# Patient Record
Sex: Male | Born: 1960 | Race: White | Hispanic: No | Marital: Married | Smoking: Current every day smoker
Health system: Southern US, Community
[De-identification: ages and names within clinical notes are randomized; demographics above are authoritative.]

## PROBLEM LIST (undated history)

## (undated) ENCOUNTER — Emergency Department (HOSPITAL_COMMUNITY): Discharge status: Absent without leave | Payer: Self-pay

## (undated) DIAGNOSIS — K7460 Unspecified cirrhosis of liver: Secondary | ICD-10-CM

---

## 2001-04-09 ENCOUNTER — Emergency Department (HOSPITAL_COMMUNITY): Admission: EM | Admit: 2001-04-09 | Discharge: 2001-04-10 | Payer: Self-pay | Admitting: Emergency Medicine

## 2019-07-25 ENCOUNTER — Other Ambulatory Visit: Payer: Self-pay

## 2019-07-25 ENCOUNTER — Encounter (HOSPITAL_COMMUNITY): Payer: Self-pay

## 2019-07-25 DIAGNOSIS — K921 Melena: Principal | ICD-10-CM | POA: Insufficient documentation

## 2019-07-25 DIAGNOSIS — F1010 Alcohol abuse, uncomplicated: Secondary | ICD-10-CM | POA: Diagnosis not present

## 2019-07-25 DIAGNOSIS — Z20828 Contact with and (suspected) exposure to other viral communicable diseases: Secondary | ICD-10-CM | POA: Diagnosis not present

## 2019-07-25 DIAGNOSIS — I8500 Esophageal varices without bleeding: Secondary | ICD-10-CM | POA: Diagnosis not present

## 2019-07-25 DIAGNOSIS — K319 Disease of stomach and duodenum, unspecified: Secondary | ICD-10-CM | POA: Insufficient documentation

## 2019-07-25 DIAGNOSIS — F17200 Nicotine dependence, unspecified, uncomplicated: Secondary | ICD-10-CM | POA: Insufficient documentation

## 2019-07-25 DIAGNOSIS — R109 Unspecified abdominal pain: Secondary | ICD-10-CM | POA: Insufficient documentation

## 2019-07-25 DIAGNOSIS — D62 Acute posthemorrhagic anemia: Secondary | ICD-10-CM | POA: Insufficient documentation

## 2019-07-25 DIAGNOSIS — K210 Gastro-esophageal reflux disease with esophagitis: Secondary | ICD-10-CM | POA: Insufficient documentation

## 2019-07-25 DIAGNOSIS — K7460 Unspecified cirrhosis of liver: Secondary | ICD-10-CM | POA: Diagnosis not present

## 2019-07-25 DIAGNOSIS — K449 Diaphragmatic hernia without obstruction or gangrene: Secondary | ICD-10-CM | POA: Insufficient documentation

## 2019-07-25 DIAGNOSIS — R945 Abnormal results of liver function studies: Secondary | ICD-10-CM | POA: Insufficient documentation

## 2019-07-25 MED ORDER — SODIUM CHLORIDE 0.9% FLUSH
3.0000 mL | Freq: Once | INTRAVENOUS | Status: AC
  Administered 2019-07-26: 3 mL via INTRAVENOUS

## 2019-07-25 NOTE — ED Triage Notes (Signed)
Pt reports abdominal pain and bloating for the last 2 weeks. Denies vomiting. Reports that he just recently cut down drinking.  Pt also reports hemoptysis starting last night. He states that he has been a smoker for many years and would like a chest XR and a TB test.   A&Ox4. Ambulatory.

## 2019-07-26 ENCOUNTER — Encounter (HOSPITAL_COMMUNITY)
Admission: EM | Disposition: A | Discharge status: Home / Self Care | Payer: Self-pay | Source: Home / Self Care | Attending: Emergency Medicine

## 2019-07-26 ENCOUNTER — Emergency Department (HOSPITAL_COMMUNITY): Payer: Medicaid Other

## 2019-07-26 ENCOUNTER — Observation Stay (HOSPITAL_COMMUNITY)
Admission: EM | Admit: 2019-07-26 | Discharge: 2019-07-27 | Disposition: A | Discharge status: Home / Self Care | Payer: Medicaid Other | Attending: Internal Medicine | Admitting: Internal Medicine

## 2019-07-26 ENCOUNTER — Observation Stay (HOSPITAL_COMMUNITY): Payer: Medicaid Other | Admitting: Anesthesiology

## 2019-07-26 ENCOUNTER — Encounter (HOSPITAL_COMMUNITY): Payer: Self-pay | Admitting: Internal Medicine

## 2019-07-26 ENCOUNTER — Observation Stay (HOSPITAL_COMMUNITY): Payer: Medicaid Other

## 2019-07-26 DIAGNOSIS — K921 Melena: Secondary | ICD-10-CM | POA: Diagnosis not present

## 2019-07-26 DIAGNOSIS — R1084 Generalized abdominal pain: Secondary | ICD-10-CM | POA: Diagnosis not present

## 2019-07-26 DIAGNOSIS — R059 Cough, unspecified: Secondary | ICD-10-CM

## 2019-07-26 DIAGNOSIS — R05 Cough: Secondary | ICD-10-CM

## 2019-07-26 DIAGNOSIS — K922 Gastrointestinal hemorrhage, unspecified: Secondary | ICD-10-CM | POA: Diagnosis not present

## 2019-07-26 DIAGNOSIS — R109 Unspecified abdominal pain: Secondary | ICD-10-CM | POA: Insufficient documentation

## 2019-07-26 DIAGNOSIS — K7460 Unspecified cirrhosis of liver: Secondary | ICD-10-CM | POA: Diagnosis not present

## 2019-07-26 DIAGNOSIS — R945 Abnormal results of liver function studies: Secondary | ICD-10-CM | POA: Diagnosis not present

## 2019-07-26 HISTORY — PX: ESOPHAGOGASTRODUODENOSCOPY (EGD) WITH PROPOFOL: SHX5813

## 2019-07-26 LAB — URINALYSIS, ROUTINE W REFLEX MICROSCOPIC
Bilirubin Urine: NEGATIVE
Glucose, UA: NEGATIVE mg/dL
Hgb urine dipstick: NEGATIVE
Ketones, ur: NEGATIVE mg/dL
Leukocytes,Ua: NEGATIVE
Nitrite: NEGATIVE
Protein, ur: NEGATIVE mg/dL
Specific Gravity, Urine: 1.027 (ref 1.005–1.030)
pH: 5 (ref 5.0–8.0)

## 2019-07-26 LAB — CBC
HCT: 37.1 % — ABNORMAL LOW (ref 39.0–52.0)
HCT: 37.5 % — ABNORMAL LOW (ref 39.0–52.0)
HCT: 37.6 % — ABNORMAL LOW (ref 39.0–52.0)
HCT: 41.4 % (ref 39.0–52.0)
Hemoglobin: 12.3 g/dL — ABNORMAL LOW (ref 13.0–17.0)
Hemoglobin: 12.4 g/dL — ABNORMAL LOW (ref 13.0–17.0)
Hemoglobin: 12.6 g/dL — ABNORMAL LOW (ref 13.0–17.0)
Hemoglobin: 13.6 g/dL (ref 13.0–17.0)
MCH: 34.9 pg — ABNORMAL HIGH (ref 26.0–34.0)
MCH: 35 pg — ABNORMAL HIGH (ref 26.0–34.0)
MCH: 35 pg — ABNORMAL HIGH (ref 26.0–34.0)
MCH: 35.3 pg — ABNORMAL HIGH (ref 26.0–34.0)
MCHC: 32.9 g/dL (ref 30.0–36.0)
MCHC: 33.1 g/dL (ref 30.0–36.0)
MCHC: 33.2 g/dL (ref 30.0–36.0)
MCHC: 33.5 g/dL (ref 30.0–36.0)
MCV: 105.3 fL — ABNORMAL HIGH (ref 80.0–100.0)
MCV: 105.6 fL — ABNORMAL HIGH (ref 80.0–100.0)
MCV: 105.7 fL — ABNORMAL HIGH (ref 80.0–100.0)
MCV: 106.4 fL — ABNORMAL HIGH (ref 80.0–100.0)
Platelets: 140 10*3/uL — ABNORMAL LOW (ref 150–400)
Platelets: 140 10*3/uL — ABNORMAL LOW (ref 150–400)
Platelets: 149 10*3/uL — ABNORMAL LOW (ref 150–400)
Platelets: 156 10*3/uL (ref 150–400)
RBC: 3.51 MIL/uL — ABNORMAL LOW (ref 4.22–5.81)
RBC: 3.55 MIL/uL — ABNORMAL LOW (ref 4.22–5.81)
RBC: 3.57 MIL/uL — ABNORMAL LOW (ref 4.22–5.81)
RBC: 3.89 MIL/uL — ABNORMAL LOW (ref 4.22–5.81)
RDW: 14.3 % (ref 11.5–15.5)
RDW: 14.3 % (ref 11.5–15.5)
RDW: 14.3 % (ref 11.5–15.5)
RDW: 14.5 % (ref 11.5–15.5)
WBC: 10.7 10*3/uL — ABNORMAL HIGH (ref 4.0–10.5)
WBC: 7.8 10*3/uL (ref 4.0–10.5)
WBC: 8.1 10*3/uL (ref 4.0–10.5)
WBC: 9 10*3/uL (ref 4.0–10.5)
nRBC: 0 % (ref 0.0–0.2)
nRBC: 0 % (ref 0.0–0.2)
nRBC: 0 % (ref 0.0–0.2)
nRBC: 0 % (ref 0.0–0.2)

## 2019-07-26 LAB — GLUCOSE, CAPILLARY
Glucose-Capillary: 88 mg/dL (ref 70–99)
Glucose-Capillary: 77 mg/dL (ref 70–99)
Glucose-Capillary: 71 mg/dL (ref 70–99)
Glucose-Capillary: 74 mg/dL (ref 70–99)

## 2019-07-26 LAB — HEPATIC FUNCTION PANEL
ALT: 65 U/L — ABNORMAL HIGH (ref 0–44)
AST: 109 U/L — ABNORMAL HIGH (ref 15–41)
Albumin: 2.7 g/dL — ABNORMAL LOW (ref 3.5–5.0)
Alkaline Phosphatase: 213 U/L — ABNORMAL HIGH (ref 38–126)
Bilirubin, Direct: 0.8 mg/dL — ABNORMAL HIGH (ref 0.0–0.2)
Indirect Bilirubin: 1.2 mg/dL — ABNORMAL HIGH (ref 0.3–0.9)
Total Bilirubin: 2 mg/dL — ABNORMAL HIGH (ref 0.3–1.2)
Total Protein: 6.4 g/dL — ABNORMAL LOW (ref 6.5–8.1)

## 2019-07-26 LAB — COMPREHENSIVE METABOLIC PANEL
ALT: 74 U/L — ABNORMAL HIGH (ref 0–44)
AST: 124 U/L — ABNORMAL HIGH (ref 15–41)
Albumin: 3.1 g/dL — ABNORMAL LOW (ref 3.5–5.0)
Alkaline Phosphatase: 268 U/L — ABNORMAL HIGH (ref 38–126)
Anion gap: 8 (ref 5–15)
BUN: 17 mg/dL (ref 6–20)
CO2: 27 mmol/L (ref 22–32)
Calcium: 9.1 mg/dL (ref 8.9–10.3)
Chloride: 103 mmol/L (ref 98–111)
Creatinine, Ser: 0.68 mg/dL (ref 0.61–1.24)
GFR calc Af Amer: >60 mL/min (ref >60)
GFR calc non Af Amer: >60 mL/min (ref >60)
Glucose, Bld: 107 mg/dL — ABNORMAL HIGH (ref 70–99)
Potassium: 4.9 mmol/L (ref 3.5–5.1)
Sodium: 138 mmol/L (ref 135–145)
Total Bilirubin: 1.8 mg/dL — ABNORMAL HIGH (ref 0.3–1.2)
Total Protein: 7.5 g/dL (ref 6.5–8.1)

## 2019-07-26 LAB — OCCULT BLOOD, POC DEVICE: Fecal Occult Bld: POSITIVE — AB

## 2019-07-26 LAB — TYPE AND SCREEN
ABO/RH(D): A POS
Antibody Screen: NEGATIVE

## 2019-07-26 LAB — PROTIME-INR
INR: 1 (ref 0.8–1.2)
Prothrombin Time: 12.9 seconds (ref 11.4–15.2)

## 2019-07-26 LAB — APTT: aPTT: 28 seconds (ref 24–36)

## 2019-07-26 LAB — SARS CORONAVIRUS 2 BY RT PCR (HOSPITAL ORDER, PERFORMED IN ~~LOC~~ HOSPITAL LAB): SARS Coronavirus 2: NEGATIVE

## 2019-07-26 LAB — LIPASE, BLOOD: Lipase: 48 U/L (ref 11–51)

## 2019-07-26 SURGERY — ESOPHAGOGASTRODUODENOSCOPY (EGD) WITH PROPOFOL
Anesthesia: Monitor Anesthesia Care

## 2019-07-26 MED ORDER — THIAMINE HCL 100 MG/ML IJ SOLN
100.0000 mg | Freq: Every day | INTRAMUSCULAR | Status: DC
  Administered 2019-07-26: 100 mg via INTRAVENOUS
  Filled 2019-07-26: qty 2

## 2019-07-26 MED ORDER — PROPOFOL 500 MG/50ML IV EMUL
INTRAVENOUS | Status: DC | PRN
  Administered 2019-07-26: 125 ug/kg/min via INTRAVENOUS

## 2019-07-26 MED ORDER — SODIUM CHLORIDE 0.9 % IV SOLN
8.0000 mg/h | INTRAVENOUS | Status: DC
  Administered 2019-07-26 (×2): 8 mg/h via INTRAVENOUS
  Filled 2019-07-26 (×4): qty 80

## 2019-07-26 MED ORDER — PROPOFOL 10 MG/ML IV BOLUS
INTRAVENOUS | Status: DC | PRN
  Administered 2019-07-26: 20 mg via INTRAVENOUS

## 2019-07-26 MED ORDER — ONDANSETRON HCL 4 MG PO TABS: 4.0000 mg | ORAL_TABLET | Freq: Four times a day (QID) | ORAL | Status: DC | PRN

## 2019-07-26 MED ORDER — ONDANSETRON HCL 4 MG/2ML IJ SOLN
4.0000 mg | Freq: Four times a day (QID) | INTRAMUSCULAR | Status: DC | PRN
  Administered 2019-07-26: 4 mg via INTRAVENOUS

## 2019-07-26 MED ORDER — SODIUM CHLORIDE 0.9 % IV SOLN: INTRAVENOUS | Status: DC

## 2019-07-26 MED ORDER — ACETAMINOPHEN 325 MG PO TABS
650.0000 mg | ORAL_TABLET | Freq: Four times a day (QID) | ORAL | Status: DC | PRN
  Administered 2019-07-27: 650 mg via ORAL
  Filled 2019-07-26: qty 2

## 2019-07-26 MED ORDER — VITAMIN B-1 100 MG PO TABS
100.0000 mg | ORAL_TABLET | Freq: Every day | ORAL | Status: DC
  Administered 2019-07-27: 100 mg via ORAL
  Filled 2019-07-26: qty 1

## 2019-07-26 MED ORDER — SODIUM CHLORIDE 0.9 % IV SOLN
1.0000 g | INTRAVENOUS | Status: DC
  Administered 2019-07-26 – 2019-07-27 (×2): 1 g via INTRAVENOUS
  Filled 2019-07-26: qty 1
  Filled 2019-07-26: qty 10

## 2019-07-26 MED ORDER — PANTOPRAZOLE SODIUM 40 MG IV SOLR: 40.0000 mg | Freq: Two times a day (BID) | INTRAVENOUS | Status: DC

## 2019-07-26 MED ORDER — ACETAMINOPHEN 650 MG RE SUPP: 650.0000 mg | Freq: Four times a day (QID) | RECTAL | Status: DC | PRN

## 2019-07-26 MED ORDER — LIDOCAINE 2% (20 MG/ML) 5 ML SYRINGE
INTRAMUSCULAR | Status: DC | PRN
  Administered 2019-07-26: 80 mg via INTRAVENOUS

## 2019-07-26 MED ORDER — LACTATED RINGERS IV SOLN
INTRAVENOUS | Status: DC
  Administered 2019-07-26 (×2): via INTRAVENOUS

## 2019-07-26 MED ORDER — MIDAZOLAM HCL 2 MG/2ML IJ SOLN
INTRAMUSCULAR | Status: AC
  Filled 2019-07-26: qty 2

## 2019-07-26 MED ORDER — PANTOPRAZOLE SODIUM 40 MG IV SOLR
40.0000 mg | Freq: Once | INTRAVENOUS | Status: AC
  Administered 2019-07-26: 40 mg via INTRAVENOUS
  Filled 2019-07-26: qty 40

## 2019-07-26 MED ORDER — MIDAZOLAM HCL 5 MG/5ML IJ SOLN
INTRAMUSCULAR | Status: DC | PRN
  Administered 2019-07-26: 2 mg via INTRAVENOUS

## 2019-07-26 SURGICAL SUPPLY — 15 items

## 2019-07-26 NOTE — ED Notes (Signed)
Assisted with rectal exam

## 2019-07-26 NOTE — Interval H&P Note (Signed)
History and Physical Interval Note:  07/26/2019 2:38 PM  Bobby Dixon  has presented today for surgery, with the diagnosis of melena.  The various methods of treatment have been discussed with the patient. After consideration of risks, benefits and other options for treatment, the patient has consented to  Procedure(s): ESOPHAGOGASTRODUODENOSCOPY (EGD) WITH PROPOFOL (N/A) as a surgical intervention.  The patient's history has been reviewed, patient examined, no change in status, stable for surgery.  I have reviewed the patient's chart and labs.  Questions were answered to the patient's satisfaction.     Youlanda Mighty [name]

## 2019-07-26 NOTE — Anesthesia Preprocedure Evaluation (Signed)
Anesthesia Evaluation  Patient identified by MRN, date of birth, ID band Patient awake    Reviewed: Allergy & Precautions, NPO status , Patient's Chart, lab work & pertinent test results  Airway Mallampati: II  TM Distance: >3 FB Neck ROM: Full    Dental no notable dental hx.    Pulmonary neg pulmonary ROS, Current Smoker,    Pulmonary exam normal breath sounds clear to auscultation       Cardiovascular negative cardio ROS Normal cardiovascular exam Rhythm:Regular Rate:Normal     Neuro/Psych negative neurological ROS  negative psych ROS   GI/Hepatic (+) Cirrhosis     substance abuse  alcohol use, GI bleed   Endo/Other  negative endocrine ROS  Renal/GU negative Renal ROS  negative genitourinary   Musculoskeletal negative musculoskeletal ROS (+)   Abdominal   Peds negative pediatric ROS (+)  Hematology negative hematology ROS (+)   Anesthesia Other Findings   Reproductive/Obstetrics negative OB ROS                             Anesthesia Physical Anesthesia Plan  ASA: II  Anesthesia Plan: MAC   Post-op Pain Management:    Induction:   PONV Risk Score and Plan: Treatment may vary due to age or medical condition  Airway Management Planned: Nasal Cannula and Simple Face Mask  Additional Equipment:   Intra-op Plan:   Post-operative Plan:   Informed Consent: I have reviewed the patients History and Physical, chart, labs and discussed the procedure including the risks, benefits and alternatives for the proposed anesthesia with the patient or authorized representative who has indicated his/her understanding and acceptance.     Dental advisory given  Plan Discussed with:   Anesthesia Plan Comments:         Anesthesia Quick Evaluation

## 2019-07-26 NOTE — Progress Notes (Signed)
Report given to floor by Levander Campion, RN patient currently in Endo

## 2019-07-26 NOTE — Anesthesia Postprocedure Evaluation (Signed)
Anesthesia Post Note  Patient: Bobby Dixon  Procedure(s) Performed: ESOPHAGOGASTRODUODENOSCOPY (EGD) WITH PROPOFOL (N/A )     Patient location during evaluation: Endoscopy Anesthesia Type: MAC Level of consciousness: awake and alert Pain management: pain level controlled Vital Signs Assessment: post-procedure vital signs reviewed and stable Respiratory status: spontaneous breathing, nonlabored ventilation, respiratory function stable and patient connected to nasal cannula oxygen Cardiovascular status: stable and blood pressure returned to baseline Postop Assessment: no apparent nausea or vomiting Anesthetic complications: no    Last Vitals:  Vitals:   07/26/19 1540 07/26/19 1607  BP: 128/83 (!) 137/96  Pulse: (!) 56 (!) 58  Resp: 18 18  Temp:  36.7 C  SpO2: 96% 98%    Last Pain:  Vitals:   07/26/19 1607  TempSrc: Oral  PainSc:                  [name]

## 2019-07-26 NOTE — Op Note (Signed)
[facility] Patient Name: Bobby Dixon Procedure Date: 07/26/2019 MRN: [id number] Attending MD: Ronald Lobo , MD Date of Birth: [birth year] CSN: [id number] Age: 58 Admit Type: Emergency Department Procedure:                Upper GI endoscopy Indications:              Melena, mild anemia, no hemodynamic instability in                            a patient with excessive alcohol consumption and                            nodular liver with slight ascites on ultrasound. Providers:                Ronald Lobo, MD, Vista Lawman, RN, Laverda Sorenson, Technician, Arnoldo Hooker, CRNA Referring MD:              Medicines:                Monitored Anesthesia Care Complications:            No immediate complications. Estimated Blood Loss:     Estimated blood loss: none. Procedure:                Pre-Anesthesia Assessment:                           - Prior to the procedure, a History and Physical                            was performed, and patient medications and                            allergies were reviewed. The patient's tolerance of                            previous anesthesia was also reviewed. The risks                            and benefits of the procedure and the sedation                            options and risks were discussed with the patient.                            All questions were answered, and informed consent                            was obtained. Prior Anticoagulants: The patient has                            taken no previous anticoagulant or antiplatelet  agents except for NSAID medication. ASA Grade                            Assessment: II - A patient with mild systemic                            disease. After reviewing the risks and benefits,                            the patient was deemed in satisfactory condition to                            undergo the procedure.        After obtaining informed consent, the endoscope was                            passed under direct vision. Throughout the                            procedure, the patient's blood pressure, pulse, and                            oxygen saturations were monitored continuously. The                            [id number] Olympus gastroscope was                            introduced through the mouth, and advanced to the                            second part of duodenum. The upper GI endoscopy was                            accomplished without difficulty. The patient                            tolerated the procedure well. Scope In: Scope Out: Findings:      The larynx was normal.      Mild multifocal erosive esophagitis with no bleeding or stigma of       hemorrhage was found right at the GE junction.      Grade II varices were found in the lower third of the esophagus.      A small hiatal hernia was present.      The stomach contained no blood or coffee ground material.      A few scattered, small non-bleeding erosions were found in the gastric       antrum, consistent with NSAID gastropathy. There was one linear       superficial ulceration. There were no stigmata of recent bleeding.      The exam of the stomach was otherwise normal.      There is no endoscopic evidence of bleeding, varices or portal       hypertension gastropathy in the entire examined stomach.      The cardia  and gastric fundus were normal on retroflexion.      The examined duodenum was normal, including 4-quadrant inspection of the       bulb. Impression:               - Normal larynx.                           - Mildly severe reflux esophagitis.                           - Grade II esophageal varices.                           - Small hiatal hernia.                           - Non-bleeding erosive gastropathy.                           - Normal examined duodenum.                           - No  specimens collected. Moderate Sedation:      This patient was sedated with monitored anesthesia care, not moderate       sedation. Recommendation:           - Continue present medications, specifically IV                            Protonix; continue PO PPI for at least 1 month post                            discharge. NSAID avoidance.                           - I elected not to band the patient's varices in                            view of their relatively small size and the                            presence of alternative sources of melena which                            seem more compatible with the patient's clinical                            history.                           - Alcohol cessation is essential in view of varices.                           - Repeat upper endoscopy in 1 year for screening  purposes to monitor varices. Procedure Code(s):        --- Professional ---                           [phone], Esophagogastroduodenoscopy, flexible,                            transoral; diagnostic, including collection of                            specimen(s) by brushing or washing, when performed                            (separate procedure) Diagnosis Code(s):        --- Professional ---                           K21.0, Gastro-esophageal reflux disease with                            esophagitis                           I85.00, Esophageal varices without bleeding                           K92.1, Melena (includes Hematochezia)                           K31.89, Other diseases of stomach and duodenum CPT copyright 2019 American Medical Association. All rights reserved. The codes documented in this report are preliminary and upon coder review may  be revised to meet current compliance requirements. Ronald Lobo, MD 07/26/2019 3:34:52 PM This report has been signed electronically. Number of Addenda: 0

## 2019-07-26 NOTE — Consult Note (Signed)
Referring Provider:  Dr. Rudolpho Sevin Primary Care Physician:  Patient, No Pcp Per Primary Gastroenterologist: None (unassigned)  Reason for Consultation: Melena  HPI: Bobby Dixon is a 58 y.o. male being admitted through the emergency room with a 2-day history of melenic stools, in the setting of a roughly 2-week history of nonspecific upper abdominal symptoms.  The patient indicates that, normally, he drinks 6-8 beers per day, more on the weekends, but about 2 weeks ago, he lost his taste for beer and, because of abdominal pain, started taking between 2 and 4 Advil tablets daily.  There is no prior history of ulcer disease or GI bleeding or alcohol related GI tract complications, although an ultrasound obtained through the emergency room does show evidence of a nodular liver suggestive of cirrhosis, and a small amount of ascites present.  He is not aware of any weight loss.  He has not had any orthostatic symptoms or presyncope in association with the dark stools.  The patient indicates he has never had colonoscopy.   History reviewed. No pertinent past medical history.  History reviewed. No pertinent surgical history.  Prior to Admission medications   Medication Sig Start Date End Date Taking? Authorizing Provider  naproxen sodium (ALEVE) 220 MG tablet Take 220 mg by mouth 2 (two) times daily as needed (pain).   Yes [provider]    Current Facility-Administered Medications  Medication Dose Route Frequency Provider Last Rate Last Dose  . acetaminophen (TYLENOL) tablet 650 mg  650 mg Oral Q6H PRN Rise Patience, MD       Or  . acetaminophen (TYLENOL) suppository 650 mg  650 mg Rectal Q6H PRN Rise Patience, MD      . cefTRIAXone (ROCEPHIN) 1 g in sodium chloride 0.9 % 100 mL IVPB  1 g Intravenous Q24H Rise Patience, MD 200 mL/hr at 07/26/19 0645 1 g at 07/26/19 0645  . ondansetron (ZOFRAN) tablet 4 mg  4 mg Oral Q6H PRN Rise Patience, MD       Or  . ondansetron [facility]) injection 4 mg  4 mg Intravenous Q6H PRN Rise Patience, MD      . pantoprazole (PROTONIX) 80 mg in sodium chloride 0.9 % 250 mL (0.32 mg/mL) infusion  8 mg/hr Intravenous Continuous Rise Patience, MD 25 mL/hr at 07/26/19 0741 8 mg/hr at 07/26/19 0741  . [START ON 07/29/2019] pantoprazole (PROTONIX) injection 40 mg  40 mg Intravenous Q12H Rise Patience, MD      . thiamine (B-1) injection 100 mg  100 mg Intravenous Daily Rise Patience, MD   100 mg at 07/26/19 [id number]   Current Outpatient Medications  Medication Sig Dispense Refill  . naproxen sodium (ALEVE) 220 MG tablet Take 220 mg by mouth 2 (two) times daily as needed (pain).      Allergies as of 07/25/2019  . (No Known Allergies)    Family History  Problem Relation Age of Onset  . Diabetes Mellitus II Neg Hx     Social History   Socioeconomic History  . Marital status: Married    Spouse name: Not on file  . Number of children: Not on file  . Years of education: Not on file  . Highest education level: Not on file  Occupational History  . Not on file  Social Needs  . Financial resource strain: Not on file  . Food insecurity    Worry: Not on file    Inability: Not on file  .  Transportation needs    Medical: Not on file    Non-medical: Not on file  Tobacco Use  . Smoking status: Current Every Day Smoker  . Smokeless tobacco: Never Used  Substance and Sexual Activity  . Alcohol use: Not Currently    Comment: quit two weeks ago  . Drug use: Not on file  . Sexual activity: Not on file  Lifestyle  . Physical activity    Days per week: Not on file    Minutes per session: Not on file  . Stress: Not on file  Relationships  . Social Herbalist on phone: Not on file    Gets together: Not on file    Attends religious service: Not on file    Active member of club or organization: Not on file    Attends meetings of clubs or organizations: Not on file     Relationship status: Not on file  . Intimate partner violence    Fear of current or ex partner: Not on file    Emotionally abused: Not on file    Physically abused: Not on file    Forced sexual activity: Not on file  Other Topics Concern  . Not on file  Social History Narrative  . Not on file    Review of Systems: The patient reports psoriasis since he was in his 25s, and dyspnea on moderate to heavy exertion (is able to walk comfortably, but not run).  No dysphagia, weight loss, exertional chest pain, lymphadenopathy, urinary symptoms.  Physical Exam: Vital signs in last 24 hours: Temp:  [98.8 F (37.1 C)-99.6 F (37.6 C)] 98.8 F (37.1 C) (08/15 0241) Pulse Rate:  [60-102] 69 (08/15 1130) Resp:  [16-24] 20 (08/15 1130) BP: (116-163)/(76-102) 116/76 (08/15 1130) SpO2:  [91 %-97 %] 95 % (08/15 1130) Weight:  [78.1 kg] 78.1 kg (08/14 2009)   General:   Alert,  Well-developed, lean but well-nourished, pleasant and cooperative in NAD Head:  Normocephalic and atraumatic. Eyes:  Sclera clear, no icterus.    Lungs:  Clear throughout to auscultation.   No wheezes, crackles, or rhonchi. No evident respiratory distress. Heart:   Regular rate and rhythm; no murmurs, clicks, rubs,  or gallops. Abdomen:  Soft, nontender, nontympanitic, and nondistended. No masses, hepatosplenomegaly or ventral hernias noted. .   Rectal:   Not performed, but stool reported as melenic, and confirmed heme positive Msk:   Symmetrical without gross deformities. Pulses:  Normal radial pulse is noted. Extremities:   Without clubbing, cyanosis, or edema. Neurologic:  Alert and coherent;  grossly normal neurologically. Skin:  Intact without significant lesions or rashes. Psych:   Alert and cooperative. Normal mood and affect.  Intake/Output from previous day: No intake/output data recorded. Intake/Output this shift: No intake/output data recorded.  Lab Results: Recent Labs    07/26/19 0024 07/26/19 0619  07/26/19 0835  WBC 10.7* 9.0 8.1  HGB 13.6 12.6* 12.4*  HCT 41.4 37.6* 37.5*  PLT 156 149* 140*   BMET Recent Labs    07/26/19 0024  NA 138  K 4.9  CL 103  CO2 27  GLUCOSE 107*  BUN 17  CREATININE 0.68  CALCIUM 9.1   LFT Recent Labs    07/26/19 0910  PROT 6.4*  ALBUMIN 2.7*  AST 109*  ALT 65*  ALKPHOS 213*  BILITOT 2.0*  BILIDIR 0.8*  IBILI 1.2*   PT/INR No results for input(s): LABPROT, INR in the last 72 hours.  Studies/Results: Dg Chest  2 View  Result Date: 07/26/2019 CLINICAL DATA:  58 year old male with cough. EXAM: CHEST - 2 VIEW COMPARISON:  None. FINDINGS: The heart size and mediastinal contours are within normal limits. Both lungs are clear. The visualized skeletal structures are unremarkable. IMPRESSION: No active cardiopulmonary disease. Electronically Signed   By: Anner Crete M.D.   On: 07/26/2019 01:44   US Abdomen Complete  Result Date: 07/26/2019 CLINICAL DATA:  Abdominal pain for several weeks. Hematemesis. Alcohol misuse. EXAM: ABDOMEN ULTRASOUND COMPLETE COMPARISON:  None FINDINGS: Gallbladder: No gallstones or wall thickening visualized. No sonographic Murphy sign noted by sonographer. Diffuse gallbladder wall thickening is likely secondary to cirrhosis. Common bile duct: Diameter: 3 mm, within normal limits. Liver: Nodularity of capsular contour is consistent with cirrhosis. Within normal limits in parenchymal echogenicity. No liver mass identified. Mild perihepatic ascites is seen. Portal vein is patent on color Doppler imaging with normal direction of blood flow towards the liver. IVC: No abnormality visualized. Pancreas: Visualized portion unremarkable. Spleen: Size and appearance within normal limits. Mild perisplenic ascites seen in left upper quadrant. Right Kidney: Length: 10.8 cm. Echogenicity within normal limits. No mass or hydronephrosis visualized. Left Kidney: Length: 10.5 cm. Echogenicity within normal limits. No mass or hydronephrosis  visualized. Abdominal aorta: No aneurysm visualized. Other findings: None. IMPRESSION: Hepatic cirrhosis.  No liver mass visualized. Mild ascites. No evidence of gallstones or biliary ductal dilatation. Diffuse gallbladder wall thickening is likely secondary to cirrhosis. Electronically Signed   By: Marlaine Hind M.D.   On: 07/26/2019 08:01    Impression: 1.  Subacute, non-destabilizing GI bleed, probably of upper tract origin in view of melenic character of stool and ulcer risk factors (NSAID exposure). 2.  Mild posthemorrhagic anemia, acute 3.  Nodular liver on ultrasound, and mild ascites present--suggestive of chronic liver disease with fibrosis if not frank cirrhosis.  Low post hydration albumin of 2.7 could be nutritional, although it could imply impaired hepatic synthetic function; mild thrombocytopenia could be bone marrow suppression from alcohol although I think mild portal hypertension is a more likely explanation in view of the fact there has been no alcohol for the past 2 weeks. 4.  Mild elevation of liver chemistries with a classic OT/PT split suggestive of alcohol induced liver inflammation.  Plan: 1.  Endoscopic evaluation later today.  Petra Kuba, purpose, risks, benefits, and alternatives of empiric medical therapy discussed and patient is agreeable to proceed.  2.  Agree with antibiotic therapy in view of GI bleeding in a cirrhotic with ascites.  3.  At present, given the very low grade of gastrointestinal bleeding, I do not think empiric octreotide is needed although if there is evidence of portal gastropathy or varices, and no alternative source of bleeding identified on endoscopic evaluation, we might consider starting it.  4.  The patient was informed of his underlying liver disease and the need to avoid alcohol going forward.  5.  He is a candidate for colon cancer screening.  Rationale discussed.  At present, he is not interested in having colonoscopy but he might change his  mind on further reflection, or at least be a candidate for annual fecal occult blood testing.  Cleotis Nipper, M.D. Pager [phone] If no answer or after 5 PM call [phone]     LOS: 0 days   [place]y  07/26/2019, 11:38 AM   Pager [phone] If no answer or after 5 PM call [phone]

## 2019-07-26 NOTE — H&P (Signed)
[facility] NOTE REDACTION - DIFFERENT PATIENT  Due to an Incorrect Registration or Identity Theft, we are redacting this note. In these particular instances; both names are NOT the same patient.  We have changed the demographic information to reflect those of the correct patient.  Please review for accuracy and ? Mark Complt the note.  Please do not refresh the body of the note.   Should the change in demographics impact clinical decisions that had been made at the time; You may choose to Edit Note from our Note Redaction.  This will create an Addendum      Deborah Kelly [employer] Identity Application Analyst [phone] [email or web address] 

## 2019-07-26 NOTE — ED Notes (Signed)
ENDO will be coming to transport patient for procedure and will be coming back to room after procedure.

## 2019-07-26 NOTE — ED Notes (Signed)
Ultrasound at bedside

## 2019-07-26 NOTE — ED Notes (Addendum)
Pt. Documented in error see note above in chart. 

## 2019-07-26 NOTE — Transfer of Care (Signed)
Immediate Anesthesia Transfer of Care Note  Patient: Bobby Dixon  Procedure(s) Performed: Procedure(s): ESOPHAGOGASTRODUODENOSCOPY (EGD) WITH PROPOFOL (N/A)  Patient Location: PACU  Anesthesia Type:MAC  Level of Consciousness:  sedated, patient cooperative and responds to stimulation  Airway & Oxygen Therapy:Patient Spontanous Breathing and Patient connected to face mask oxgen  Post-op Assessment:  Report given to PACU RN and Post -op Vital signs reviewed and stable  Post vital signs:  Reviewed and stable  Last Vitals:  Vitals:   07/26/19 1405 07/26/19 1510  BP: (!) 141/96 112/68  Pulse: 73 76  Resp: 18 (!) 23  Temp: 36.5 C (!) 36.1 C  SpO2: 39% 03%    Complications: No apparent anesthesia complications

## 2019-07-26 NOTE — H&P (View-Only) (Signed)
Referring Provider:  Dr. Rudolpho Sevin Primary Care Physician:  Patient, No Pcp Per Primary Gastroenterologist: None (unassigned)  Reason for Consultation: Melena  HPI: Bobby Dixon is a 58 y.o. male being admitted through the emergency room with a 2-day history of melenic stools, in the setting of a roughly 2-week history of nonspecific upper abdominal symptoms.  The patient indicates that, normally, he drinks 6-8 beers per day, more on the weekends, but about 2 weeks ago, he lost his taste for beer and, because of abdominal pain, started taking between 2 and 4 Advil tablets daily.  There is no prior history of ulcer disease or GI bleeding or alcohol related GI tract complications, although an ultrasound obtained through the emergency room does show evidence of a nodular liver suggestive of cirrhosis, and a small amount of ascites present.  He is not aware of any weight loss.  He has not had any orthostatic symptoms or presyncope in association with the dark stools.  The patient indicates he has never had colonoscopy.   History reviewed. No pertinent past medical history.  History reviewed. No pertinent surgical history.  Prior to Admission medications   Medication Sig Start Date End Date Taking? Authorizing Provider  naproxen sodium (ALEVE) 220 MG tablet Take 220 mg by mouth 2 (two) times daily as needed (pain).   Yes [provider]    Current Facility-Administered Medications  Medication Dose Route Frequency Provider Last Rate Last Dose  . acetaminophen (TYLENOL) tablet 650 mg  650 mg Oral Q6H PRN Rise Patience, MD       Or  . acetaminophen (TYLENOL) suppository 650 mg  650 mg Rectal Q6H PRN Rise Patience, MD      . cefTRIAXone (ROCEPHIN) 1 g in sodium chloride 0.9 % 100 mL IVPB  1 g Intravenous Q24H Rise Patience, MD 200 mL/hr at 07/26/19 0645 1 g at 07/26/19 0645  . ondansetron (ZOFRAN) tablet 4 mg  4 mg Oral Q6H PRN Rise Patience, MD       Or  . ondansetron [facility]) injection 4 mg  4 mg Intravenous Q6H PRN Rise Patience, MD      . pantoprazole (PROTONIX) 80 mg in sodium chloride 0.9 % 250 mL (0.32 mg/mL) infusion  8 mg/hr Intravenous Continuous Rise Patience, MD 25 mL/hr at 07/26/19 0741 8 mg/hr at 07/26/19 0741  . [START ON 07/29/2019] pantoprazole (PROTONIX) injection 40 mg  40 mg Intravenous Q12H Rise Patience, MD      . thiamine (B-1) injection 100 mg  100 mg Intravenous Daily Rise Patience, MD   100 mg at 07/26/19 [id number]   Current Outpatient Medications  Medication Sig Dispense Refill  . naproxen sodium (ALEVE) 220 MG tablet Take 220 mg by mouth 2 (two) times daily as needed (pain).      Allergies as of 07/25/2019  . (No Known Allergies)    Family History  Problem Relation Age of Onset  . Diabetes Mellitus II Neg Hx     Social History   Socioeconomic History  . Marital status: Married    Spouse name: Not on file  . Number of children: Not on file  . Years of education: Not on file  . Highest education level: Not on file  Occupational History  . Not on file  Social Needs  . Financial resource strain: Not on file  . Food insecurity    Worry: Not on file    Inability: Not on file  .  Transportation needs    Medical: Not on file    Non-medical: Not on file  Tobacco Use  . Smoking status: Current Every Day Smoker  . Smokeless tobacco: Never Used  Substance and Sexual Activity  . Alcohol use: Not Currently    Comment: quit two weeks ago  . Drug use: Not on file  . Sexual activity: Not on file  Lifestyle  . Physical activity    Days per week: Not on file    Minutes per session: Not on file  . Stress: Not on file  Relationships  . Social Herbalist on phone: Not on file    Gets together: Not on file    Attends religious service: Not on file    Active member of club or organization: Not on file    Attends meetings of clubs or organizations: Not on file     Relationship status: Not on file  . Intimate partner violence    Fear of current or ex partner: Not on file    Emotionally abused: Not on file    Physically abused: Not on file    Forced sexual activity: Not on file  Other Topics Concern  . Not on file  Social History Narrative  . Not on file    Review of Systems: The patient reports psoriasis since he was in his 55s, and dyspnea on moderate to heavy exertion (is able to walk comfortably, but not run).  No dysphagia, weight loss, exertional chest pain, lymphadenopathy, urinary symptoms.  Physical Exam: Vital signs in last 24 hours: Temp:  [98.8 F (37.1 C)-99.6 F (37.6 C)] 98.8 F (37.1 C) (08/15 0241) Pulse Rate:  [60-102] 69 (08/15 1130) Resp:  [16-24] 20 (08/15 1130) BP: (116-163)/(76-102) 116/76 (08/15 1130) SpO2:  [91 %-97 %] 95 % (08/15 1130) Weight:  [78.1 kg] 78.1 kg (08/14 2009)   General:   Alert,  Well-developed, lean but well-nourished, pleasant and cooperative in NAD Head:  Normocephalic and atraumatic. Eyes:  Sclera clear, no icterus.    Lungs:  Clear throughout to auscultation.   No wheezes, crackles, or rhonchi. No evident respiratory distress. Heart:   Regular rate and rhythm; no murmurs, clicks, rubs,  or gallops. Abdomen:  Soft, nontender, nontympanitic, and nondistended. No masses, hepatosplenomegaly or ventral hernias noted. .   Rectal:   Not performed, but stool reported as melenic, and confirmed heme positive Msk:   Symmetrical without gross deformities. Pulses:  Normal radial pulse is noted. Extremities:   Without clubbing, cyanosis, or edema. Neurologic:  Alert and coherent;  grossly normal neurologically. Skin:  Intact without significant lesions or rashes. Psych:   Alert and cooperative. Normal mood and affect.  Intake/Output from previous day: No intake/output data recorded. Intake/Output this shift: No intake/output data recorded.  Lab Results: Recent Labs    07/26/19 0024 07/26/19 0619  07/26/19 0835  WBC 10.7* 9.0 8.1  HGB 13.6 12.6* 12.4*  HCT 41.4 37.6* 37.5*  PLT 156 149* 140*   BMET Recent Labs    07/26/19 0024  NA 138  K 4.9  CL 103  CO2 27  GLUCOSE 107*  BUN 17  CREATININE 0.68  CALCIUM 9.1   LFT Recent Labs    07/26/19 0910  PROT 6.4*  ALBUMIN 2.7*  AST 109*  ALT 65*  ALKPHOS 213*  BILITOT 2.0*  BILIDIR 0.8*  IBILI 1.2*   PT/INR No results for input(s): LABPROT, INR in the last 72 hours.  Studies/Results: Dg Chest  2 View  Result Date: 07/26/2019 CLINICAL DATA:  58 year old male with cough. EXAM: CHEST - 2 VIEW COMPARISON:  None. FINDINGS: The heart size and mediastinal contours are within normal limits. Both lungs are clear. The visualized skeletal structures are unremarkable. IMPRESSION: No active cardiopulmonary disease. Electronically Signed   By: Anner Crete M.D.   On: 07/26/2019 01:44   US Abdomen Complete  Result Date: 07/26/2019 CLINICAL DATA:  Abdominal pain for several weeks. Hematemesis. Alcohol misuse. EXAM: ABDOMEN ULTRASOUND COMPLETE COMPARISON:  None FINDINGS: Gallbladder: No gallstones or wall thickening visualized. No sonographic Murphy sign noted by sonographer. Diffuse gallbladder wall thickening is likely secondary to cirrhosis. Common bile duct: Diameter: 3 mm, within normal limits. Liver: Nodularity of capsular contour is consistent with cirrhosis. Within normal limits in parenchymal echogenicity. No liver mass identified. Mild perihepatic ascites is seen. Portal vein is patent on color Doppler imaging with normal direction of blood flow towards the liver. IVC: No abnormality visualized. Pancreas: Visualized portion unremarkable. Spleen: Size and appearance within normal limits. Mild perisplenic ascites seen in left upper quadrant. Right Kidney: Length: 10.8 cm. Echogenicity within normal limits. No mass or hydronephrosis visualized. Left Kidney: Length: 10.5 cm. Echogenicity within normal limits. No mass or hydronephrosis  visualized. Abdominal aorta: No aneurysm visualized. Other findings: None. IMPRESSION: Hepatic cirrhosis.  No liver mass visualized. Mild ascites. No evidence of gallstones or biliary ductal dilatation. Diffuse gallbladder wall thickening is likely secondary to cirrhosis. Electronically Signed   By: Marlaine Hind M.D.   On: 07/26/2019 08:01    Impression: 1.  Subacute, non-destabilizing GI bleed, probably of upper tract origin in view of melenic character of stool and ulcer risk factors (NSAID exposure). 2.  Mild posthemorrhagic anemia, acute 3.  Nodular liver on ultrasound, and mild ascites present--suggestive of chronic liver disease with fibrosis if not frank cirrhosis.  Low post hydration albumin of 2.7 could be nutritional, although it could imply impaired hepatic synthetic function; mild thrombocytopenia could be bone marrow suppression from alcohol although I think mild portal hypertension is a more likely explanation in view of the fact there has been no alcohol for the past 2 weeks. 4.  Mild elevation of liver chemistries with a classic OT/PT split suggestive of alcohol induced liver inflammation.  Plan: 1.  Endoscopic evaluation later today.  Petra Kuba, purpose, risks, benefits, and alternatives of empiric medical therapy discussed and patient is agreeable to proceed.  2.  Agree with antibiotic therapy in view of GI bleeding in a cirrhotic with ascites.  3.  At present, given the very low grade of gastrointestinal bleeding, I do not think empiric octreotide is needed although if there is evidence of portal gastropathy or varices, and no alternative source of bleeding identified on endoscopic evaluation, we might consider starting it.  4.  The patient was informed of his underlying liver disease and the need to avoid alcohol going forward.  5.  He is a candidate for colon cancer screening.  Rationale discussed.  At present, he is not interested in having colonoscopy but he might change his  mind on further reflection, or at least be a candidate for annual fecal occult blood testing.  Cleotis Nipper, M.D. Pager [phone] If no answer or after 5 PM call [phone]     LOS: 0 days   Sylvia  07/26/2019, 11:38 AM   Pager [phone] If no answer or after 5 PM call [phone]

## 2019-07-26 NOTE — Progress Notes (Deleted)
Patient feels like her abdomen is "getting tight again."  Eating small amounts.  She says it "does not taste good" but no real complaint of actual food intolerance or abdominal pain with eating.  On exam, the abdomen has a moderate amount of tympany and is slightly protuberant and firm, but not hard or tender.  Recommendations:  1.  I would like to see how the patient does with nutritional supplements.  I will ask the nurse to provide 2 ounces of Ensure every 2 hours while awake  2.  I will reevaluate the patient tomorrow.  If her oral intake remains poor, I would favor endoscopic evaluation on Monday.  3.  I have cut the patient's maintenance IV back to p.o., since she says she can take water okay.  This should help to minimize recurrent formation of ascites.  Please feel free to call me to discuss the patient's case, as desired.  Cleotis Nipper, M.D. Pager [phone] If no answer or after 5 PM call [phone]

## 2019-07-26 NOTE — ED Provider Notes (Signed)
[name] DEPT Provider Note   CSN: [id number] Arrival date & time: 07/25/19  2002     History   Chief Complaint Chief Complaint  Patient presents with  . Abdominal Pain  . Hemoptysis    HPI Bobby Dixon is a 58 y.o. male.     The history is provided by the patient.  Cough Cough characteristics:  Productive Severity:  Moderate Timing:  Intermittent Chronicity:  Chronic Smoker: yes   Relieved by:  Nothing Worsened by:  Nothing Associated symptoms: no chest pain, no fever and no shortness of breath   Patient with history of alcohol abuse is a daily smoker presents with multiple complaints.  He reports he has been coughing for years.  He reports yesterday he had an episode of coughing very hard, he coughed up small amt of bloody sputum.  Otherwise the sputum has been gray in color.  No new chest pain or shortness of breath.  He reports quitting alcohol 2 weeks ago.  He used to drink at least 6-8 beers a day for years.  He reports since that time has had abdominal swelling and fullness. He has  Mild abdominal discomfort.  He also reports black diarrhea stool  PMH-none Soc hx -daily smoker, alcohol abuse, recently quit 2 weeks ago Home Medications    Prior to Admission medications   Not on File    Family History History reviewed. No pertinent family history.  Social History Social History   Tobacco Use  . Smoking status: Not on file  Substance Use Topics  . Alcohol use: Not on file  . Drug use: Not on file     Allergies   Patient has no known allergies.   Review of Systems Review of Systems  Constitutional: Negative for fever.  HENT: Negative for nosebleeds.   Respiratory: Positive for cough. Negative for shortness of breath.   Cardiovascular: Negative for chest pain.  Gastrointestinal: Negative for vomiting.  Neurological: Negative for weakness and light-headedness.  All other systems reviewed and are negative.     Physical Exam Updated Vital Signs BP (!) 150/102 (BP Location: Left Arm)   Pulse 80   Temp 98.8 F (37.1 C) (Oral)   Resp 16   Wt 78.1 kg   SpO2 95%   Physical Exam CONSTITUTIONAL: no distress, appears older than stated age HEAD: Normocephalic/atraumatic EYES: EOMI/PERRL ENMT: Mucous membranes moist, no blood in nose or oropharynx NECK: supple no meningeal signs SPINE/BACK:entire spine nontender CV: S1/S2 noted, no murmurs/rubs/gallops noted LUNGS: Lungs are clear to auscultation bilaterally, no apparent distress ABDOMEN: soft, mild distention noted.  No focal tenderness, no rebound or guarding Rectal -small amount of black stool noted.  No blood.  Chaperone present for exam.  No rectal mass Hemoccult positive GU:no cva tenderness NEURO: Pt is awake/alert/appropriate, moves all extremitiesx4.  No facial droop.   EXTREMITIES: pulses normal/equal, full ROM SKIN: warm, color normal PSYCH: no abnormalities of mood noted, alert and oriented to situation   ED Treatments / Results  Labs (all labs ordered are listed, but only abnormal results are displayed) Labs Reviewed  COMPREHENSIVE METABOLIC PANEL - Abnormal; Notable for the following components:      Result Value   Glucose, Bld 107 (*)    Albumin 3.1 (*)    AST 124 (*)    ALT 74 (*)    Alkaline Phosphatase 268 (*)    Total Bilirubin 1.8 (*)    All other components within normal limits  CBC -  Abnormal; Notable for the following components:   WBC 10.7 (*)    RBC 3.89 (*)    MCV 106.4 (*)    MCH 35.0 (*)    All other components within normal limits  URINALYSIS, ROUTINE W REFLEX MICROSCOPIC - Abnormal; Notable for the following components:   Color, Urine AMBER (*)    All other components within normal limits  OCCULT BLOOD, POC DEVICE - Abnormal; Notable for the following components:   Fecal Occult Bld POSITIVE (*)    All other components within normal limits  SARS CORONAVIRUS 2 (HOSPITAL ORDER, [facility])  LIPASE, BLOOD  POC OCCULT BLOOD, ED    EKG None  Radiology Dg Chest 2 View  Result Date: 07/26/2019 CLINICAL DATA:  58 year old male with cough. EXAM: CHEST - 2 VIEW COMPARISON:  None. FINDINGS: The heart size and mediastinal contours are within normal limits. Both lungs are clear. The visualized skeletal structures are unremarkable. IMPRESSION: No active cardiopulmonary disease. Electronically Signed   By: Anner Crete M.D.   On: 07/26/2019 01:44    Procedures Procedures  Medications Ordered in ED Medications  sodium chloride flush (NS) 0.9 % injection 3 mL (has no administration in time range)     Initial Impression / Assessment and Plan / ED Course  I have reviewed the triage vital signs and the nursing notes.  Pertinent labs & imaging results that were available during my care of the patient were reviewed by me and considered in my medical decision making (see chart for details).        3:39 AM Patient reports he thought he may have TB because of the coughing andSmoking for years.  No known TB exposures.  I think patient was confused and thought he may have emphysema rather than TB. 4:39 AM Patient presented with multiple complaints.  He has had increasing cough, did have an episode of hemoptysis.  He will be tested for COVID-19  Patient reports recent black stool.  He was Hemoccult positive.  He reports recent use of NSAIDs, and is an alcoholic.  He is high risk for acute GI bleed.  I feel he would need to be admitted for further evaluation.  He would benefit from monitoring, GI consultation.  His abdominal distention is likely due to alcohol abuse and potentially cirrhosis. 4:52 AM D/w dr Hal Hope for admission   Leda Gauze was evaluated in Emergency Department on 07/26/2019 for the symptoms described in the history of present illness. He was evaluated in the context of the global COVID-19 pandemic, which necessitated consideration that the  patient might be at risk for infection with the SARS-CoV-2 virus that causes COVID-19. Institutional protocols and algorithms that pertain to the evaluation of patients at risk for COVID-19 are in a state of rapid change based on information released by regulatory bodies including the CDC and federal and state organizations. These policies and algorithms were followed during the patient's care in the ED.  Final Clinical Impressions(s) / ED Diagnoses   Final diagnoses:  Acute GI bleeding  Cough    ED Discharge Orders    None       Ripley Fraise, MD 07/26/19 [phone]

## 2019-07-26 NOTE — Progress Notes (Signed)
Patient's endoscopy shows no bleeding.  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  Recommendations:  1.  Okay for clear liquid diet (ordered)  2.  I think the patient could be transitioned to oral PPI therapy tomorrow and potentially discharged if he is clinically stable.  3.  It is important for this patient to stop nonsteroidal anti-inflammatory drugs  4.  It is absolutely essential for this patient to stop alcohol, since he is showing signs of significant underlying liver disease, some of which could conceivably be reversible if he stops drinking.  5.  The patient should have follow-up, ideally with community [facility] center, in a week or 2, to monitor his hemoglobin and assess compliance with alcohol avoidance.  6.  I will put the patient on a one-year follow-up list to do a repeat endoscopy to monitor his varices.  Cleotis Nipper, M.D. Pager [phone] If no answer or after 5 PM call [phone]

## 2019-07-26 NOTE — ED Notes (Signed)
Pt. CBG 88, RN, Claiborne Billings made aware.

## 2019-07-26 NOTE — ED Notes (Signed)
Pt transported to Endo 

## 2019-07-26 NOTE — Progress Notes (Signed)
In and examined.  HPI reviewed.  Patient with history of alcohol abuse presented with abdominal pain, melena and vomiting.  Patient found to have transaminases, patient was a started on Protonix drip.  1-acute GI bleed, melena: Patient with transaminases,  ultrasound with evidence of liver cirrhosis as well. GI has been consulted for endoscopy. Continue with IV Protonix and IV antibiotics to cover for SBP.  2-transaminases, abdominal pain: Ultrasound shows hepatic cirrhosis.   Mild ascites. Follow trend.  INR. Related to alcohol abuse.  3-alcohol abuse: He reports last treatment was 2 weeks prior to admission.  Monitor on CIWA.  Niel Hummer, MD

## 2019-07-27 DIAGNOSIS — K922 Gastrointestinal hemorrhage, unspecified: Secondary | ICD-10-CM | POA: Diagnosis not present

## 2019-07-27 LAB — BASIC METABOLIC PANEL
Anion gap: 9 (ref 5–15)
BUN: 13 mg/dL (ref 6–20)
CO2: 24 mmol/L (ref 22–32)
Calcium: 8.5 mg/dL — ABNORMAL LOW (ref 8.9–10.3)
Chloride: 105 mmol/L (ref 98–111)
Creatinine, Ser: 0.74 mg/dL (ref 0.61–1.24)
GFR calc Af Amer: >60 mL/min (ref >60)
GFR calc non Af Amer: >60 mL/min (ref >60)
Glucose, Bld: 91 mg/dL (ref 70–99)
Potassium: 4.3 mmol/L (ref 3.5–5.1)
Sodium: 138 mmol/L (ref 135–145)

## 2019-07-27 LAB — CBC
HCT: 37.7 % — ABNORMAL LOW (ref 39.0–52.0)
Hemoglobin: 12.4 g/dL — ABNORMAL LOW (ref 13.0–17.0)
MCH: 34.7 pg — ABNORMAL HIGH (ref 26.0–34.0)
MCHC: 32.9 g/dL (ref 30.0–36.0)
MCV: 105.6 fL — ABNORMAL HIGH (ref 80.0–100.0)
Platelets: 138 10*3/uL — ABNORMAL LOW (ref 150–400)
RBC: 3.57 MIL/uL — ABNORMAL LOW (ref 4.22–5.81)
RDW: 14.5 % (ref 11.5–15.5)
WBC: 8.1 10*3/uL (ref 4.0–10.5)
nRBC: 0 % (ref 0.0–0.2)

## 2019-07-27 LAB — HEPATIC FUNCTION PANEL
ALT: 61 U/L — ABNORMAL HIGH (ref 0–44)
AST: 103 U/L — ABNORMAL HIGH (ref 15–41)
Albumin: 2.6 g/dL — ABNORMAL LOW (ref 3.5–5.0)
Alkaline Phosphatase: 228 U/L — ABNORMAL HIGH (ref 38–126)
Bilirubin, Direct: 0.7 mg/dL — ABNORMAL HIGH (ref 0.0–0.2)
Indirect Bilirubin: 0.9 mg/dL (ref 0.3–0.9)
Total Bilirubin: 1.6 mg/dL — ABNORMAL HIGH (ref 0.3–1.2)
Total Protein: 6.5 g/dL (ref 6.5–8.1)

## 2019-07-27 LAB — HIV ANTIBODY (ROUTINE TESTING W REFLEX): HIV Screen 4th Generation wRfx: NONREACTIVE

## 2019-07-27 LAB — ABO/RH: ABO/RH(D): A POS

## 2019-07-27 LAB — GLUCOSE, CAPILLARY: Glucose-Capillary: 124 mg/dL — ABNORMAL HIGH (ref 70–99)

## 2019-07-27 MED ORDER — FOLIC ACID 1 MG PO TABS: 1.0000 mg | ORAL_TABLET | Freq: Every day | ORAL | 3 refills | Status: DC

## 2019-07-27 MED ORDER — PANTOPRAZOLE SODIUM 40 MG PO TBEC: 40.0000 mg | DELAYED_RELEASE_TABLET | Freq: Two times a day (BID) | ORAL | 1 refills | Status: DC

## 2019-07-27 MED ORDER — THIAMINE HCL 100 MG PO TABS: 100.0000 mg | ORAL_TABLET | Freq: Every day | ORAL | 0 refills | Status: DC

## 2019-07-27 NOTE — Progress Notes (Signed)
Hemoglobin stable at 12.4.  BUN remains normal at 13.

## 2019-07-27 NOTE — Discharge Summary (Signed)
[facility] NOTE REDACTION - DIFFERENT PATIENT  Due to an Incorrect Registration or Identity Theft, we are redacting this note. In these particular instances; both names are NOT the same patient.  We have changed the demographic information to reflect those of the correct patient.  Please review for accuracy and ? Mark Complt the note.  Please do not refresh the body of the note.   Should the change in demographics impact clinical decisions that had been made at the time; You may choose to Edit Note from our Note Redaction.  This will create an Addendum      Deborah Kelly [employer] Identity Application Analyst [phone] [email or web address] 

## 2019-07-27 NOTE — TOC Initial Note (Signed)
Transition of Care [facility]) - Initial/Assessment Note    Patient Details  Name: JERIK YILDIZ MRN: [id number] Date of Birth: 12/12/1960  Transition of Care (TOC) CM/SW Contact:    Joaquin Courts, RN Phone Number: 07/27/2019, 9:58 AM  Clinical Narrative:                 CM spoke with patient at bedside regarding need for pcp and follow-up appointment. Information provided about [facility] with instruction for patient to call first thing in the morning to set up pcp visit and establish care. CM unable to prearrange appointment prior to dc due to clinic being closed on Sunday. CM provided patient with goodrx coupon to cover the cost of protonix prescription. Patient informed of financial assistance availability through [facility] pharmacy to assist with future prescription costs.  Expected Discharge Plan: Home/Self Care Barriers to Discharge: No Barriers Identified   Patient Goals and CMS Choice Patient states their goals for this hospitalization and ongoing recovery are:: to go home      Expected Discharge Plan and Services Expected Discharge Plan: Home/Self Care   Discharge Planning Services: CM Consult   Living arrangements for the past 2 months: Single Family Home Expected Discharge Date: 07/27/19               DME Arranged: N/A DME Agency: NA       HH Arranged: NA [place] Agency: NA        Prior Living Arrangements/Services Living arrangements for the past 2 months: Single Family Home Lives with:: Self Patient language and need for interpreter reviewed:: Yes Do you feel safe going back to the place where you live?: Yes      Need for Family Participation in Patient Care: Yes (Comment) Care giver support system in place?: Yes (comment)   Criminal Activity/Legal Involvement Pertinent to Current Situation/Hospitalization: No - Comment as needed  Activities of Daily Living Home Assistive Devices/Equipment: None ADL Screening (condition at time of admission) Patient's cognitive  ability adequate to safely complete daily activities?: Yes Is the patient deaf or have difficulty hearing?: No Does the patient have difficulty seeing, even when wearing glasses/contacts?: No Does the patient have difficulty concentrating, remembering, or making decisions?: No Patient able to express need for assistance with ADLs?: No Does the patient have difficulty dressing or bathing?: No Independently performs ADLs?: Yes (appropriate for developmental age) Communication: Independent Dressing (OT): Independent Grooming: Independent Feeding: Independent Bathing: Independent Toileting: Independent In/Out Bed: Independent Walks in Home: Independent Does the patient have difficulty walking or climbing stairs?: No Weakness of Legs: None Weakness of Arms/Hands: None  Permission Sought/Granted                  Emotional Assessment Appearance:: Appears stated age Attitude/Demeanor/Rapport: Engaged Affect (typically observed): Accepting Orientation: : Oriented to Place, Oriented to  Time, Oriented to Situation, Oriented to Self   Psych Involvement: No (comment)  Admission diagnosis:  Cough [R05] Acute GI bleeding [K92.2] Abdominal pain [R10.9] Patient Active Problem List   Diagnosis Date Noted  . Acute GI bleeding 07/26/2019  . Abdominal discomfort 07/26/2019  . Abdominal pain    PCP:  Patient, No Pcp Per Pharmacy:   [address] [facility] [place] [address] Phone: [phone] Fax: [phone]     Social Determinants of Health (SDOH) Interventions    Readmission Risk Interventions No flowsheet data found.

## 2019-07-27 NOTE — Progress Notes (Signed)
Reviewed discharge instructions with patient including medications to discontinue and medications to start. Also reviewed the [facility] and number to call on Monday to schedule an appointment. Pt verbalized understanding

## 2019-07-28 ENCOUNTER — Encounter (HOSPITAL_COMMUNITY): Payer: Self-pay | Admitting: Gastroenterology

## 2019-08-04 NOTE — Progress Notes (Signed)
Patient ID: Bobby Dixon, male   DOB: 01/16/1961, 58 y.o.   MRN: [id number]  Virtual Visit via Telephone Note  I connected with Bobby Dixon on 08/06/19 at  8:50 AM EDT by telephone and verified that I am speaking with the correct person using two identifiers.   I discussed the limitations, risks, security and privacy concerns of performing an evaluation and management service by telephone and the availability of in person appointments. I also discussed with the patient that there may be a patient responsible charge related to this service. The patient expressed understanding and agreed to proceed.  Patient location:  home My Location:  [facility] office Persons on the call:  Me, the patient, and his daughter  History of Present Illness: After hospitlization 8/15-8/16/2020 for melena, elevated LFT, and vomiting.  Still having abdominal swelling and pain.  No vomiting.  Some nausea.  No fever.  Hasn't drank alcohol in 3 weeks and 3 days.  Not doing AA/recovery program.  Hasn't had further melena or hematochezia.  Has appt with GI 09/26/2019.  Pain not worse, just not better.    From discharge summary:  Brief/Interim Summary: Patient with history of alcohol abuse presented with abdominal pain, melena and vomiting. Patient found to have transaminases, patient was a started on Protonix drip.   1-Acute GI bleed, melena: Patient withtransaminases,ultrasound with evidence of liver cirrhosis as well. GI has been consulted for endoscopy. He was treated with  IV Protonix and IV antibiotics to cover for SBP. He underwent endoscopy which showed mildly to severe reflux esophagitis, grade 2 hiatal hernia, nonbleeding erosive gastropathy.  Patient will be discharged on PPI twice daily.  He will be discharged today if he is able to tolerate diet. Patient will require screening endoscopy in 1 year.  2-transaminases, abdominal pain: Ultrasound showshepatic cirrhosis.Mild ascites. Early signs of  cirrhosis  Counseling provided to the patient. Related to alcohol abuse.  3-alcohol abuse: He reports last treatment was 2 weeks prior to admission. Monitor on CIWA. No evidence of alcohol withdrawal  Discharge Diagnoses:  Principal Problem:   Acute GI bleeding Active Problems:   Abdominal discomfort    Observations/Objective:  A&Ox3   Assessment and Plan: 1. Elevated liver enzymes - Comprehensive metabolic panel; Future  2. Generalized abdominal pain Call for earlier appt wity GI.  To ED if worsens  3. Gastrointestinal hemorrhage with melena Call for earlier appt with GI - CBC with Differential/Platelet; Future  4. Nausea - ondansetron (ZOFRAN) 8 MG tablet; Take 1 tablet (8 mg total) by mouth every 8 (eight) hours as needed for nausea or vomiting.  Dispense: 20 tablet; Refill: 0  5. Alcohol abuse I have counseled the patient at length about substance abuse and addiction.  12 step meetings/recovery recommended.  Local 12 step meeting lists were given and attendance was encouraged.  Patient expresses understanding.   Follow Up Instructions: 6 weeks assign PCP   I discussed the assessment and treatment plan with the patient. The patient was provided an opportunity to ask questions and all were answered. The patient agreed with the plan and demonstrated an understanding of the instructions.   The patient was advised to call back or seek an in-person evaluation if the symptoms worsen or if the condition fails to improve as anticipated.  I provided 22 minutes of non-face-to-face time during this encounter.   Freeman Caldron, PA-C

## 2019-08-06 ENCOUNTER — Telehealth: Payer: Self-pay | Admitting: *Deleted

## 2019-08-06 ENCOUNTER — Ambulatory Visit: Payer: Medicaid Other | Attending: Family Medicine | Admitting: Physician Assistant

## 2019-08-06 ENCOUNTER — Other Ambulatory Visit: Payer: Self-pay

## 2019-08-06 DIAGNOSIS — R748 Abnormal levels of other serum enzymes: Secondary | ICD-10-CM | POA: Diagnosis not present

## 2019-08-06 DIAGNOSIS — F1010 Alcohol abuse, uncomplicated: Secondary | ICD-10-CM | POA: Insufficient documentation

## 2019-08-06 DIAGNOSIS — R110 Nausea: Secondary | ICD-10-CM

## 2019-08-06 DIAGNOSIS — K921 Melena: Secondary | ICD-10-CM | POA: Diagnosis not present

## 2019-08-06 DIAGNOSIS — R1084 Generalized abdominal pain: Secondary | ICD-10-CM | POA: Diagnosis not present

## 2019-08-06 MED ORDER — PANTOPRAZOLE SODIUM 40 MG PO TBEC: 40.0000 mg | DELAYED_RELEASE_TABLET | Freq: Every day | ORAL | 3 refills | Status: AC

## 2019-08-06 MED ORDER — ONDANSETRON HCL 8 MG PO TABS: 8.0000 mg | ORAL_TABLET | Freq: Three times a day (TID) | ORAL | 0 refills | Status: AC | PRN

## 2019-08-06 NOTE — Progress Notes (Signed)
Patient verified DOB Patient has eaten today. Patient has taken medication. Patient complains of abdominal discomfort with edema. Patient complains of discomfort being present for the past 3 weeks.

## 2019-08-06 NOTE — Telephone Encounter (Signed)
Patient provided updated contact info

## 2019-08-15 ENCOUNTER — Encounter (HOSPITAL_COMMUNITY): Payer: Self-pay

## 2019-08-15 ENCOUNTER — Other Ambulatory Visit: Payer: Self-pay | Admitting: Emergency Medicine

## 2019-08-15 ENCOUNTER — Emergency Department (HOSPITAL_COMMUNITY)
Admission: EM | Admit: 2019-08-15 | Discharge: 2019-08-15 | Disposition: A | Discharge status: Home / Self Care | Payer: Medicaid Other | Attending: Emergency Medicine | Admitting: Emergency Medicine

## 2019-08-15 ENCOUNTER — Emergency Department (HOSPITAL_COMMUNITY): Payer: Medicaid Other

## 2019-08-15 ENCOUNTER — Other Ambulatory Visit: Payer: Self-pay

## 2019-08-15 DIAGNOSIS — F17200 Nicotine dependence, unspecified, uncomplicated: Secondary | ICD-10-CM | POA: Diagnosis not present

## 2019-08-15 DIAGNOSIS — Z79899 Other long term (current) drug therapy: Secondary | ICD-10-CM | POA: Diagnosis not present

## 2019-08-15 DIAGNOSIS — K7031 Alcoholic cirrhosis of liver with ascites: Secondary | ICD-10-CM | POA: Diagnosis not present

## 2019-08-15 DIAGNOSIS — R109 Unspecified abdominal pain: Secondary | ICD-10-CM

## 2019-08-15 LAB — CBC WITH DIFFERENTIAL/PLATELET
Abs Immature Granulocytes: 0.08 10*3/uL — ABNORMAL HIGH (ref 0.00–0.07)
Basophils Absolute: 0.1 10*3/uL (ref 0.0–0.1)
Basophils Relative: 1 %
Eosinophils Absolute: 0.3 10*3/uL (ref 0.0–0.5)
Eosinophils Relative: 3 %
HCT: 39.9 % (ref 39.0–52.0)
Hemoglobin: 13.1 g/dL (ref 13.0–17.0)
Immature Granulocytes: 1 %
Lymphocytes Relative: 26 %
Lymphs Abs: 2.5 10*3/uL (ref 0.7–4.0)
MCH: 35.1 pg — ABNORMAL HIGH (ref 26.0–34.0)
MCHC: 32.8 g/dL (ref 30.0–36.0)
MCV: 107 fL — ABNORMAL HIGH (ref 80.0–100.0)
Monocytes Absolute: 0.8 10*3/uL (ref 0.1–1.0)
Monocytes Relative: 8 %
Neutro Abs: 5.7 10*3/uL (ref 1.7–7.7)
Neutrophils Relative %: 61 %
Platelets: 196 10*3/uL (ref 150–400)
RBC: 3.73 MIL/uL — ABNORMAL LOW (ref 4.22–5.81)
RDW: 16.1 % — ABNORMAL HIGH (ref 11.5–15.5)
WBC: 9.4 10*3/uL (ref 4.0–10.5)
nRBC: 0 % (ref 0.0–0.2)

## 2019-08-15 LAB — COMPREHENSIVE METABOLIC PANEL
ALT: 78 U/L — ABNORMAL HIGH (ref 0–44)
AST: 122 U/L — ABNORMAL HIGH (ref 15–41)
Albumin: 2.9 g/dL — ABNORMAL LOW (ref 3.5–5.0)
Alkaline Phosphatase: 487 U/L — ABNORMAL HIGH (ref 38–126)
Anion gap: 11 (ref 5–15)
BUN: 16 mg/dL (ref 6–20)
CO2: 23 mmol/L (ref 22–32)
Calcium: 9.3 mg/dL (ref 8.9–10.3)
Chloride: 105 mmol/L (ref 98–111)
Creatinine, Ser: 0.7 mg/dL (ref 0.61–1.24)
GFR calc Af Amer: >60 mL/min (ref >60)
GFR calc non Af Amer: >60 mL/min (ref >60)
Glucose, Bld: 88 mg/dL (ref 70–99)
Potassium: 4.1 mmol/L (ref 3.5–5.1)
Sodium: 139 mmol/L (ref 135–145)
Total Bilirubin: 1.6 mg/dL — ABNORMAL HIGH (ref 0.3–1.2)
Total Protein: 7.8 g/dL (ref 6.5–8.1)

## 2019-08-15 LAB — URINALYSIS, ROUTINE W REFLEX MICROSCOPIC
Bacteria, UA: NONE SEEN
Glucose, UA: NEGATIVE mg/dL
Hgb urine dipstick: NEGATIVE
Ketones, ur: NEGATIVE mg/dL
Leukocytes,Ua: NEGATIVE
Nitrite: NEGATIVE
Protein, ur: 30 mg/dL — AB
Specific Gravity, Urine: 1.04 — ABNORMAL HIGH (ref 1.005–1.030)
pH: 5 (ref 5.0–8.0)

## 2019-08-15 LAB — BODY FLUID CELL COUNT WITH DIFFERENTIAL
Eos, Fluid: 1 %
Lymphs, Fluid: 45 %
Monocyte-Macrophage-Serous Fluid: 51 % (ref 50–90)
Neutrophil Count, Fluid: 3 % (ref 0–25)
Total Nucleated Cell Count, Fluid: 380 cu mm (ref 0–1000)

## 2019-08-15 LAB — AMMONIA: Ammonia: 41 umol/L — ABNORMAL HIGH (ref 9–35)

## 2019-08-15 LAB — LIPASE, BLOOD: Lipase: 32 U/L (ref 11–51)

## 2019-08-15 LAB — SAMPLE TO BLOOD BANK

## 2019-08-15 LAB — PROTIME-INR
INR: 1 (ref 0.8–1.2)
Prothrombin Time: 13.3 seconds (ref 11.4–15.2)

## 2019-08-15 MED ORDER — LIDOCAINE HCL 1 % IJ SOLN
INTRAMUSCULAR | Status: AC
  Administered 2019-08-15: 15:00:00
  Filled 2019-08-15: qty 10

## 2019-08-15 NOTE — ED Provider Notes (Signed)
[facility] DEPT Provider Note Note: There is an additional medical recordMRN: [id number] that contains information from the patient's last hospitalization   CSN: [id number] Arrival date & time: 08/15/19  1124     History   Chief Complaint Chief Complaint  Patient presents with   Abdominal Pain    HPI Bobby Dixon is a 58 y.o. male.     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  Patient was found to have esophageal varices.  Since that time the patient has stopped drinking any alcohol.  Patient however continues to have issues with abdominal discomfort.  He is also had significant intermittent episodes of loose stools.  He has noticed increasing abdominal distention and discomfort.  It is affecting his ability to do any activities.  He has not had any issues with any fevers or chills.  No nausea or vomiting.  Patient was instructed by his doctor to come to the ED for possible paracentesis.  He is still waiting to have his outpatient GI consult. History reviewed. No pertinent past medical history.  Patient Active Problem List   Diagnosis Date Noted   Acute GI bleeding 07/26/2019   Abdominal discomfort 07/26/2019   Abdominal pain     History reviewed. No pertinent surgical history.      Home Medications    Prior to Admission medications   Medication Sig Start Date End Date Taking? Authorizing Provider  acetaminophen (TYLENOL) 325 MG tablet Take 650 mg by mouth every 6 (six) hours as needed for mild pain, moderate pain or headache.   Yes [provider]  folic acid (FOLVITE) 1 MG tablet Take 1 mg by mouth daily. 07/27/19  Yes [provider]  ondansetron (ZOFRAN) 8 MG tablet Take 8 mg by mouth every 8 (eight) hours as needed for nausea/vomiting. 08/06/19  Yes [provider]  pantoprazole (PROTONIX) 40 MG tablet Take 40 mg by mouth  daily. 08/06/19  Yes [provider]  Thiamine HCl (VITAMIN B1) 100 MG TABS Take 1 tablet by mouth daily. 07/27/19  Yes [provider]    Family History Family History  Problem Relation Age of Onset   Diabetes Mellitus II Neg Hx     Social History Social History   Tobacco Use   Smoking status: Current Every Day Smoker   Smokeless tobacco: Never Used  Substance Use Topics   Alcohol use: Not Currently    Comment: quit two weeks ago   Drug use: Not on file     Allergies   Patient has no known allergies.   Review of Systems Review of Systems  All other systems reviewed and are negative.    Physical Exam Updated Vital Signs BP 127/88    Pulse 90    Temp 98.6 F (37 C) (Oral)    Resp 16    Ht 1.829 m (6')    Wt 69 kg    SpO2 94%    BMI 20.63 kg/m   Physical Exam Vitals signs and nursing note reviewed.  Constitutional:      General: He is not in acute distress.    Appearance: He is well-developed.  HENT:     Head: Normocephalic and atraumatic.     Right Ear: External ear normal.     Left Ear: External ear normal.  Eyes:     General: No scleral icterus.  Right eye: No discharge.        Left eye: No discharge.     Conjunctiva/sclera: Conjunctivae normal.  Neck:     Musculoskeletal: Neck supple.     Trachea: No tracheal deviation.  Cardiovascular:     Rate and Rhythm: Normal rate and regular rhythm.  Pulmonary:     Effort: Pulmonary effort is normal. No respiratory distress.     Breath sounds: Normal breath sounds. No stridor. No wheezing or rales.  Abdominal:     General: Bowel sounds are normal. There is distension.     Palpations: Abdomen is soft. There is fluid wave.     Tenderness: There is abdominal tenderness. There is no guarding or rebound.  Musculoskeletal:        General: No tenderness.  Skin:    General: Skin is warm and dry.     Findings: No rash.  Neurological:     Mental Status: He is alert.     Cranial Nerves: No  cranial nerve deficit (no facial droop, extraocular movements intact, no slurred speech).     Sensory: No sensory deficit.     Motor: No abnormal muscle tone or seizure activity.     Coordination: Coordination normal.      ED Treatments / Results  Labs (all labs ordered are listed, but only abnormal results are displayed) Labs Reviewed  CBC WITH DIFFERENTIAL/PLATELET - Abnormal; Notable for the following components:      Result Value   RBC 3.73 (*)    MCV 107.0 (*)    MCH 35.1 (*)    RDW 16.1 (*)    Abs Immature Granulocytes 0.08 (*)    All other components within normal limits  COMPREHENSIVE METABOLIC PANEL - Abnormal; Notable for the following components:   Albumin 2.9 (*)    AST 122 (*)    ALT 78 (*)    Alkaline Phosphatase 487 (*)    Total Bilirubin 1.6 (*)    All other components within normal limits  AMMONIA - Abnormal; Notable for the following components:   Ammonia 41 (*)    All other components within normal limits  BODY FLUID CELL COUNT WITH DIFFERENTIAL - Abnormal; Notable for the following components:   Appearance, Fluid HAZY (*)    All other components within normal limits  BODY FLUID CULTURE  LIPASE, BLOOD  PROTIME-INR  URINALYSIS, ROUTINE W REFLEX MICROSCOPIC  PATHOLOGIST SMEAR REVIEW  SAMPLE TO BLOOD BANK    EKG EKG Interpretation  Date/Time:  Friday August 15 2019 11:50:16 EDT Ventricular Rate:  92 PR Interval:    QRS Duration: 103 QT Interval:  367 QTC Calculation: 454 R Axis:   40 Text Interpretation:  Sinus rhythm Abnormal R-wave progression, early transition No old tracing to compare Confirmed by Dorie Rank [id number]) on 08/15/2019 12:27:53 PM   Radiology US Paracentesis  Result Date: 08/15/2019 INDICATION: Patient with history of abdominal pain/distension, ascites. Request made for diagnostic and therapeutic paracentesis. EXAM: ULTRASOUND GUIDED DIAGNOSTIC AND THERAPEUTIC PARACENTESIS MEDICATIONS: None COMPLICATIONS: None immediate.  PROCEDURE: Informed written consent was obtained from the patient after a discussion of the risks, benefits and alternatives to treatment. A timeout was performed prior to the initiation of the procedure. Initial ultrasound scanning demonstrates a moderate amount of ascites within the right lower abdominal quadrant. The right lower abdomen was prepped and draped in the usual sterile fashion. 1% lidocaine was used for local anesthesia. Following this, a 19 gauge, 7-cm, Yueh catheter was introduced. An ultrasound image was  saved for documentation purposes. The paracentesis was performed. The catheter was removed and a dressing was applied. The patient tolerated the procedure well without immediate post procedural complication. FINDINGS: A total of approximately 3.6 liters of yellow fluid was removed. Samples were sent to the laboratory as requested by the clinical team. IMPRESSION: Successful ultrasound-guided diagnostic and therapeutic paracentesis yielding 3.6 liters of peritoneal fluid. Read by: Rowe Robert, PA-C Electronically Signed   By: Markus Daft M.D.   On: 08/15/2019 15:54   Dg Chest Portable 1 View  Result Date: 08/15/2019 CLINICAL DATA:  Dyspnea EXAM: PORTABLE CHEST 1 VIEW COMPARISON:  07/26/2019 FINDINGS: The heart size and mediastinal contours are within normal limits. Redemonstrated elevation of the left hemidiaphragm with associated atelectasis or scarring. There may be a small left pleural effusion. The visualized skeletal structures are unremarkable. IMPRESSION: Redemonstrated elevation of the left hemidiaphragm with associated atelectasis or scarring. There may be a small left pleural effusion. No definite acute airspace opacity. PA and lateral radiographs may be helpful to further evaluate. Electronically Signed   By: Eddie Candle M.D.   On: 08/15/2019 13:30    Procedures Procedures (including critical care time)  Medications Ordered in ED Medications  lidocaine (XYLOCAINE) 1 % (with  pres) injection (  Given by Other 08/15/19 1510)     Initial Impression / Assessment and Plan / ED Course  I have reviewed the triage vital signs and the nursing notes.  Pertinent labs & imaging results that were available during my care of the patient were reviewed by me and considered in my medical decision making (see chart for details).  Clinical Course as of Aug 15 1655  Fri Aug 15, 2019  1654 Labs are reviewed.  Patient has elevated LFTs consistent with alcoholic liver disease.  White blood cell count is not elevated.  No evidence of anemia.  Peritoneal fluid is not suggestive of acute infection.   [JK]    Clinical Course User Index [JK] Dorie Rank, MD  Patient presented with worsening ascites.  No fever or or other symptoms to suggest spontaneous bacterial peritonitis.  Patient had a successful paracentesis by PA Allred from interventional radiology.  Appreciate their assistance.  Patient is stable for outpatient follow-up with his primary care doctor and GI physicians.  Warning signs and precautions discussed.  Final Clinical Impressions(s) / ED Diagnoses   Final diagnoses:  Abdominal pain  Ascites due to alcoholic cirrhosis (HCC)      Dorie Rank, MD 08/15/19 1656

## 2019-08-15 NOTE — ED Triage Notes (Signed)
Pt arrives POV for abdominal/epigastric pain, bloating and distention, going on since yesterday.

## 2019-08-15 NOTE — ED Notes (Signed)
Made request for a urine sample patient unable to provide one at this time

## 2019-08-15 NOTE — Procedures (Signed)
Ultrasound-guided diagnostic and therapeutic paracentesis performed yielding 3.6 liters of yellow  fluid.  A portion of the fluid was submitted to the lab for pre-ordered studies.  No immediate complications. EBL none.

## 2019-08-15 NOTE — Discharge Instructions (Signed)
Continue your current medication regimen, follow-up with your GI doctor as planned, follow-up with your doctor if you start to notice your developing recurrent abdominal swelling.

## 2019-08-19 LAB — PATHOLOGIST SMEAR REVIEW

## 2019-08-20 ENCOUNTER — Other Ambulatory Visit: Payer: Self-pay

## 2019-08-23 ENCOUNTER — Inpatient Hospital Stay (HOSPITAL_COMMUNITY): Payer: Medicaid Other | Admitting: Registered Nurse

## 2019-08-23 ENCOUNTER — Encounter (HOSPITAL_COMMUNITY): Payer: Self-pay | Admitting: Emergency Medicine

## 2019-08-23 ENCOUNTER — Other Ambulatory Visit: Payer: Self-pay

## 2019-08-23 ENCOUNTER — Encounter (HOSPITAL_COMMUNITY)
Admission: EM | Disposition: A | Discharge status: Home / Self Care | Payer: Self-pay | Source: Home / Self Care | Attending: Internal Medicine

## 2019-08-23 ENCOUNTER — Inpatient Hospital Stay (HOSPITAL_COMMUNITY)
Admission: EM | Admit: 2019-08-23 | Discharge: 2019-08-26 | DRG: 369 | Disposition: A | Discharge status: Home / Self Care | Payer: Medicaid Other | Attending: Internal Medicine | Admitting: Internal Medicine

## 2019-08-23 DIAGNOSIS — K209 Esophagitis, unspecified: Secondary | ICD-10-CM | POA: Diagnosis present

## 2019-08-23 DIAGNOSIS — I8510 Secondary esophageal varices without bleeding: Secondary | ICD-10-CM

## 2019-08-23 DIAGNOSIS — I9589 Other hypotension: Secondary | ICD-10-CM | POA: Diagnosis not present

## 2019-08-23 DIAGNOSIS — D62 Acute posthemorrhagic anemia: Secondary | ICD-10-CM | POA: Diagnosis present

## 2019-08-23 DIAGNOSIS — Z79899 Other long term (current) drug therapy: Secondary | ICD-10-CM | POA: Diagnosis not present

## 2019-08-23 DIAGNOSIS — Z20828 Contact with and (suspected) exposure to other viral communicable diseases: Secondary | ICD-10-CM | POA: Diagnosis present

## 2019-08-23 DIAGNOSIS — K7460 Unspecified cirrhosis of liver: Secondary | ICD-10-CM

## 2019-08-23 DIAGNOSIS — K449 Diaphragmatic hernia without obstruction or gangrene: Secondary | ICD-10-CM | POA: Diagnosis present

## 2019-08-23 DIAGNOSIS — E639 Nutritional deficiency, unspecified: Secondary | ICD-10-CM | POA: Diagnosis present

## 2019-08-23 DIAGNOSIS — I8501 Esophageal varices with bleeding: Secondary | ICD-10-CM | POA: Insufficient documentation

## 2019-08-23 DIAGNOSIS — K922 Gastrointestinal hemorrhage, unspecified: Secondary | ICD-10-CM | POA: Insufficient documentation

## 2019-08-23 DIAGNOSIS — F17210 Nicotine dependence, cigarettes, uncomplicated: Secondary | ICD-10-CM | POA: Diagnosis present

## 2019-08-23 DIAGNOSIS — R0902 Hypoxemia: Secondary | ICD-10-CM | POA: Diagnosis present

## 2019-08-23 DIAGNOSIS — D72829 Elevated white blood cell count, unspecified: Secondary | ICD-10-CM | POA: Diagnosis present

## 2019-08-23 DIAGNOSIS — R188 Other ascites: Secondary | ICD-10-CM

## 2019-08-23 DIAGNOSIS — I959 Hypotension, unspecified: Secondary | ICD-10-CM | POA: Diagnosis present

## 2019-08-23 DIAGNOSIS — D689 Coagulation defect, unspecified: Secondary | ICD-10-CM | POA: Diagnosis present

## 2019-08-23 DIAGNOSIS — K7031 Alcoholic cirrhosis of liver with ascites: Secondary | ICD-10-CM | POA: Diagnosis present

## 2019-08-23 DIAGNOSIS — K259 Gastric ulcer, unspecified as acute or chronic, without hemorrhage or perforation: Secondary | ICD-10-CM | POA: Diagnosis present

## 2019-08-23 DIAGNOSIS — D7589 Other specified diseases of blood and blood-forming organs: Secondary | ICD-10-CM | POA: Diagnosis present

## 2019-08-23 DIAGNOSIS — Z66 Do not resuscitate: Secondary | ICD-10-CM | POA: Diagnosis present

## 2019-08-23 DIAGNOSIS — Z95828 Presence of other vascular implants and grafts: Secondary | ICD-10-CM

## 2019-08-23 DIAGNOSIS — I5189 Other ill-defined heart diseases: Secondary | ICD-10-CM | POA: Diagnosis not present

## 2019-08-23 DIAGNOSIS — F1010 Alcohol abuse, uncomplicated: Secondary | ICD-10-CM | POA: Diagnosis present

## 2019-08-23 DIAGNOSIS — I8511 Secondary esophageal varices with bleeding: Secondary | ICD-10-CM | POA: Diagnosis present

## 2019-08-23 HISTORY — PX: ESOPHAGOGASTRODUODENOSCOPY (EGD) WITH PROPOFOL: SHX5813

## 2019-08-23 HISTORY — DX: Unspecified cirrhosis of liver: K74.60

## 2019-08-23 HISTORY — PX: ESOPHAGEAL BANDING: SHX5518

## 2019-08-23 LAB — CBC
HCT: 32.2 % — ABNORMAL LOW (ref 39.0–52.0)
Hemoglobin: 10.4 g/dL — ABNORMAL LOW (ref 13.0–17.0)
MCH: 35 pg — ABNORMAL HIGH (ref 26.0–34.0)
MCHC: 32.3 g/dL (ref 30.0–36.0)
MCV: 108.4 fL — ABNORMAL HIGH (ref 80.0–100.0)
Platelets: 218 10*3/uL (ref 150–400)
RBC: 2.97 MIL/uL — ABNORMAL LOW (ref 4.22–5.81)
RDW: 16.5 % — ABNORMAL HIGH (ref 11.5–15.5)
WBC: 14.7 10*3/uL — ABNORMAL HIGH (ref 4.0–10.5)
nRBC: 0 % (ref 0.0–0.2)

## 2019-08-23 LAB — COMPREHENSIVE METABOLIC PANEL
ALT: 117 U/L — ABNORMAL HIGH (ref 0–44)
AST: 143 U/L — ABNORMAL HIGH (ref 15–41)
Albumin: 2.7 g/dL — ABNORMAL LOW (ref 3.5–5.0)
Alkaline Phosphatase: 500 U/L — ABNORMAL HIGH (ref 38–126)
Anion gap: 13 (ref 5–15)
BUN: 34 mg/dL — ABNORMAL HIGH (ref 6–20)
CO2: 21 mmol/L — ABNORMAL LOW (ref 22–32)
Calcium: 9 mg/dL (ref 8.9–10.3)
Chloride: 104 mmol/L (ref 98–111)
Creatinine, Ser: 0.85 mg/dL (ref 0.61–1.24)
GFR calc Af Amer: >60 mL/min (ref >60)
GFR calc non Af Amer: >60 mL/min (ref >60)
Glucose, Bld: 122 mg/dL — ABNORMAL HIGH (ref 70–99)
Potassium: 4.6 mmol/L (ref 3.5–5.1)
Sodium: 138 mmol/L (ref 135–145)
Total Bilirubin: 1.8 mg/dL — ABNORMAL HIGH (ref 0.3–1.2)
Total Protein: 7.4 g/dL (ref 6.5–8.1)

## 2019-08-23 LAB — PROTIME-INR
INR: 1 (ref 0.8–1.2)
Prothrombin Time: 13 seconds (ref 11.4–15.2)

## 2019-08-23 LAB — HEMOGLOBIN AND HEMATOCRIT, BLOOD
HCT: 27.9 % — ABNORMAL LOW (ref 39.0–52.0)
Hemoglobin: 8.8 g/dL — ABNORMAL LOW (ref 13.0–17.0)

## 2019-08-23 LAB — SARS CORONAVIRUS 2 BY RT PCR (HOSPITAL ORDER, PERFORMED IN ~~LOC~~ HOSPITAL LAB): SARS Coronavirus 2: NEGATIVE

## 2019-08-23 LAB — MRSA PCR SCREENING: MRSA by PCR: POSITIVE — AB

## 2019-08-23 SURGERY — ESOPHAGOGASTRODUODENOSCOPY (EGD) WITH PROPOFOL
Anesthesia: General

## 2019-08-23 MED ORDER — CHLORHEXIDINE GLUCONATE CLOTH 2 % EX PADS
6.0000 | MEDICATED_PAD | Freq: Every day | CUTANEOUS | Status: DC
  Administered 2019-08-23 – 2019-08-25 (×3): 6 via TOPICAL

## 2019-08-23 MED ORDER — SODIUM CHLORIDE 0.9 % IV SOLN
8.0000 mg/h | INTRAVENOUS | Status: AC
  Administered 2019-08-23 – 2019-08-26 (×6): 8 mg/h via INTRAVENOUS
  Filled 2019-08-23 (×9): qty 80

## 2019-08-23 MED ORDER — LIDOCAINE HCL (CARDIAC) PF 100 MG/5ML IV SOSY
PREFILLED_SYRINGE | INTRAVENOUS | Status: DC | PRN
  Administered 2019-08-23: 50 mg via INTRAVENOUS

## 2019-08-23 MED ORDER — PHENYLEPHRINE HCL (PRESSORS) 10 MG/ML IV SOLN
INTRAVENOUS | Status: DC | PRN
  Administered 2019-08-23: 120 ug via INTRAVENOUS

## 2019-08-23 MED ORDER — ONDANSETRON HCL 4 MG/2ML IJ SOLN
4.0000 mg | Freq: Once | INTRAMUSCULAR | Status: AC
  Administered 2019-08-23: 4 mg via INTRAVENOUS
  Filled 2019-08-23: qty 2

## 2019-08-23 MED ORDER — SODIUM CHLORIDE 0.9 % IV SOLN
1.0000 g | Freq: Once | INTRAVENOUS | Status: AC
  Administered 2019-08-23: 1 g via INTRAVENOUS
  Filled 2019-08-23: qty 10

## 2019-08-23 MED ORDER — VITAMIN B-1 100 MG PO TABS
100.0000 mg | ORAL_TABLET | Freq: Every day | ORAL | Status: DC
  Administered 2019-08-24 – 2019-08-26 (×3): 100 mg via ORAL
  Filled 2019-08-23 (×3): qty 1

## 2019-08-23 MED ORDER — FENTANYL CITRATE (PF) 100 MCG/2ML IJ SOLN
INTRAMUSCULAR | Status: DC | PRN
  Administered 2019-08-23: 50 ug via INTRAVENOUS

## 2019-08-23 MED ORDER — SODIUM CHLORIDE 0.9 % IV SOLN
50.0000 ug/h | INTRAVENOUS | Status: DC
  Administered 2019-08-23 – 2019-08-26 (×6): 50 ug/h via INTRAVENOUS
  Filled 2019-08-23 (×10): qty 1

## 2019-08-23 MED ORDER — SUCCINYLCHOLINE CHLORIDE 20 MG/ML IJ SOLN
INTRAMUSCULAR | Status: DC | PRN
  Administered 2019-08-23: 100 mg via INTRAVENOUS

## 2019-08-23 MED ORDER — CHLORHEXIDINE GLUCONATE CLOTH 2 % EX PADS: 6.0000 | MEDICATED_PAD | Freq: Every day | CUTANEOUS | Status: DC

## 2019-08-23 MED ORDER — SODIUM CHLORIDE 0.9 % IV SOLN
INTRAVENOUS | Status: DC
  Administered 2019-08-23 – 2019-08-26 (×5): via INTRAVENOUS

## 2019-08-23 MED ORDER — PROPOFOL 10 MG/ML IV BOLUS
INTRAVENOUS | Status: AC
  Filled 2019-08-23: qty 20

## 2019-08-23 MED ORDER — DEXTROSE-NACL 5-0.9 % IV SOLN
INTRAVENOUS | Status: DC | PRN
  Administered 2019-08-23: 13:00:00 via INTRAVENOUS

## 2019-08-23 MED ORDER — PROPOFOL 10 MG/ML IV BOLUS
INTRAVENOUS | Status: DC | PRN
  Administered 2019-08-23: 100 mg via INTRAVENOUS

## 2019-08-23 MED ORDER — SODIUM CHLORIDE 0.9 % IV SOLN
2.0000 g | INTRAVENOUS | Status: DC
  Administered 2019-08-24 – 2019-08-26 (×3): 2 g via INTRAVENOUS
  Filled 2019-08-23: qty 20
  Filled 2019-08-23 (×2): qty 2

## 2019-08-23 MED ORDER — MUPIROCIN 2 % EX OINT
1.0000 "application " | TOPICAL_OINTMENT | Freq: Two times a day (BID) | CUTANEOUS | Status: DC
  Administered 2019-08-23 – 2019-08-26 (×6): 1 via NASAL
  Filled 2019-08-23 (×3): qty 22

## 2019-08-23 MED ORDER — FENTANYL CITRATE (PF) 100 MCG/2ML IJ SOLN
INTRAMUSCULAR | Status: AC
  Filled 2019-08-23: qty 2

## 2019-08-23 MED ORDER — NADOLOL 20 MG PO TABS
20.0000 mg | ORAL_TABLET | Freq: Every day | ORAL | Status: DC
  Administered 2019-08-24: 20 mg via ORAL
  Filled 2019-08-23: qty 1

## 2019-08-23 MED ORDER — PANTOPRAZOLE SODIUM 40 MG IV SOLR
40.0000 mg | Freq: Two times a day (BID) | INTRAVENOUS | Status: DC
  Filled 2019-08-23: qty 40

## 2019-08-23 MED ORDER — OCTREOTIDE LOAD VIA INFUSION
50.0000 ug | Freq: Once | INTRAVENOUS | Status: AC
  Administered 2019-08-23: 50 ug via INTRAVENOUS
  Filled 2019-08-23: qty 25

## 2019-08-23 MED ORDER — DEXAMETHASONE SODIUM PHOSPHATE 4 MG/ML IJ SOLN
INTRAMUSCULAR | Status: DC | PRN
  Administered 2019-08-23: 8 mg via INTRAVENOUS

## 2019-08-23 MED ORDER — SODIUM CHLORIDE 0.9 % IV SOLN: 1.0000 g | INTRAVENOUS | Status: DC

## 2019-08-23 MED ORDER — SODIUM CHLORIDE 0.9 % IV SOLN: Freq: Once | INTRAVENOUS | Status: DC

## 2019-08-23 MED ORDER — ONDANSETRON HCL 4 MG/2ML IJ SOLN
INTRAMUSCULAR | Status: DC | PRN
  Administered 2019-08-23: 4 mg via INTRAVENOUS

## 2019-08-23 MED ORDER — SODIUM CHLORIDE 0.9 % IV SOLN
INTRAVENOUS | Status: DC | PRN
  Administered 2019-08-23: 17:00:00 25 ug/min via INTRAVENOUS

## 2019-08-23 MED ORDER — SODIUM CHLORIDE 0.9 % IV SOLN
80.0000 mg | Freq: Once | INTRAVENOUS | Status: AC
  Administered 2019-08-23: 80 mg via INTRAVENOUS
  Filled 2019-08-23: qty 80

## 2019-08-23 MED ORDER — ORAL CARE MOUTH RINSE
15.0000 mL | Freq: Two times a day (BID) | OROMUCOSAL | Status: DC
  Administered 2019-08-23 – 2019-08-26 (×6): 15 mL via OROMUCOSAL

## 2019-08-23 MED ORDER — FOLIC ACID 1 MG PO TABS
1.0000 mg | ORAL_TABLET | Freq: Every day | ORAL | Status: DC
  Administered 2019-08-24 – 2019-08-26 (×3): 1 mg via ORAL
  Filled 2019-08-23 (×3): qty 1

## 2019-08-23 SURGICAL SUPPLY — 14 items

## 2019-08-23 NOTE — Op Note (Signed)
[facility] Patient Name: Bobby Dixon Procedure Date: 08/23/2019 MRN: [id number] Attending MD: Carlota Raspberry. Havery Moros , MD Date of Birth: 08/22/1961 CSN: [id number] Age: 58 Admit Type: Inpatient Procedure:                Upper GI endoscopy Indications:              Hematemesis, Melena, history of cirrhosis and                            esophageal varices, history of ulcers and recent                            NSAID use Providers:                Remo Lipps P. Havery Moros, MD, Ashley Jacobs, RN,                            Auguste Dalton, Technician Referring MD:              Medicines:                Monitored Anesthesia Care Complications:            No immediate complications. Estimated blood loss:                            Minimal. Estimated Blood Loss:     Estimated blood loss was minimal. Procedure:                Pre-Anesthesia Assessment:                           - Prior to the procedure, a History and Physical                            was performed, and patient medications and                            allergies were reviewed. The patient's tolerance of                            previous anesthesia was also reviewed. The risks                            and benefits of the procedure and the sedation                            options and risks were discussed with the patient.                            All questions were answered, and informed consent                            was obtained. Prior Anticoagulants: The patient has                            taken no previous  anticoagulant or antiplatelet                            agents. ASA Grade Assessment: III - A patient with                            severe systemic disease. After reviewing the risks                            and benefits, the patient was deemed in                            satisfactory condition to undergo the procedure.                           After obtaining informed consent, the  endoscope was                            passed under direct vision. Throughout the                            procedure, the patient's blood pressure, pulse, and                            oxygen saturations were monitored continuously. The                            GIF-H190 ([id number] Olympus gastroscope was                            introduced through the mouth, and advanced to the                            second part of duodenum. The upper GI endoscopy was                            accomplished without difficulty. The patient                            tolerated the procedure well. Scope In: Scope Out: Findings:      Esophagogastric landmarks were identified: the Z-line was found at 35       cm, the gastroesophageal junction was found at 35 cm and the upper       extent of the gastric folds was found at 38 cm from the incisors.      A 3 cm hiatal hernia was present.      Large varices were found in the middle third of the esophagus and in the       lower third of the esophagus with red whale markings. Six bands were       successfully placed in the lower to mid esophagus, resulting in       deflation of varices.      The exam of the esophagus was otherwise normal.      Hematin and some red blood was found in the entire examined  stomach.       This was lavaged.      The exam of the stomach was otherwise normal. No obvious gastric       pathology noted to cause bleeding. No obvious gastric varices.      The duodenal bulb and second portion of the duodenum were normal with       some old blood which was lavaged. Impression:               - Esophagogastric landmarks identified.                           - 3 cm hiatal hernia.                           - Large esophageal varices with red whale signs -                            banded x 6.                           - Hematin in the entire stomach - lavaged without                            other gastric pathology ntoed.                            - Normal duodenal bulb and second portion of the                            duodenum.                           Varices are the most likely cause for the patient's                            bleeding and were banded. Moderate Sedation:      No moderate sedation, case performed with MAC Recommendation:           - return patient to hospital ward for ongoing care.                            This patient should go to the step down unit for                            monitoring given recent variceal bleed / banding,                            at risk for rebleeding                           - continue NPO this evening                           - continue present medications - IV octreotide drip                            /  protonix                           - continue ceftriaxone                           - serial Hgb - transfuse to keep Hgb > 7                           - consideration for TIPS during this                            hospitalization given his recent course and                            decompensated liver disease, will discuss with IR                            tomorrow                           - call with questions, GI will continue to follow Procedure Code(s):        --- Professional ---                           [phone], Esophagogastroduodenoscopy, flexible,                            transoral; with band ligation of esophageal/gastric                            varices Diagnosis Code(s):        --- Professional ---                           K44.9, Diaphragmatic hernia without obstruction or                            gangrene                           I85.00, Esophageal varices without bleeding                           K92.2, Gastrointestinal hemorrhage, unspecified                           K92.0, Hematemesis                           K92.1, Melena (includes Hematochezia) CPT copyright 2019 [employer]. All rights reserved. The codes  documented in this report are preliminary and upon coder review may  be revised to meet current compliance requirements. Remo Lipps P. Armbruster, MD 08/23/2019 5:18:34 PM This report has been signed electronically. Number of Addenda: 0

## 2019-08-23 NOTE — Transfer of Care (Signed)
Immediate Anesthesia Transfer of Care Note  Patient: Bobby Dixon  Procedure(s) Performed: ESOPHAGOGASTRODUODENOSCOPY (EGD) WITH PROPOFOL (N/A ) ESOPHAGEAL BANDING  Patient Location: PACU  Anesthesia Type:General  Level of Consciousness: awake, alert , oriented and patient cooperative  Airway & Oxygen Therapy: Patient Spontanous Breathing and Patient connected to face mask oxygen  Post-op Assessment: Report given to RN, Post -op Vital signs reviewed and stable and Patient moving all extremities X 4  Post vital signs: stable  Last Vitals:  Vitals Value Taken Time  BP 133/86 08/23/19 1720  Temp 37.1 C 08/23/19 1717  Pulse 90 08/23/19 1727  Resp 36 08/23/19 1727  SpO2 86 % 08/23/19 1727  Vitals shown include unvalidated device data.  Last Pain:  Vitals:   08/23/19 1720  TempSrc:   PainSc: 0-No pain         Complications: No apparent anesthesia complications

## 2019-08-23 NOTE — ED Notes (Signed)
Covid swab obtained and walked to lab.

## 2019-08-23 NOTE — ED Notes (Signed)
Vomited bright red blood approx 1/2 cup

## 2019-08-23 NOTE — ED Provider Notes (Signed)
[place] DEPT Provider Note   CSN: [id number] Arrival date & time: 08/23/19  1153     History   Chief Complaint Chief Complaint  Patient presents with   Rectal Bleeding    HPI Bobby Dixon is a 58 y.o. male.     58 yo M with a chief complaints of bloody stool.  This started last night and worsened this morning.  No vomiting some cramps to his abdomen right before he has a bowel movement but no other pain.  Patient was recently here for a paracentesis.  Is scheduled to see a gastroenterologist but has not yet done so.  Was diagnosed with alcoholic cirrhosis.  Quit drinking about a month ago.  Patient has never had a history of GI bleeds.  Has been taking NSAIDs for abdominal pain that comes and goes.  He denies fevers or chills.  Denies blood thinner use.  The history is provided by the patient.  Rectal Bleeding Quality:  Bright red Amount:  Moderate Duration:  2 days Timing:  Constant Chronicity:  New Similar prior episodes: no   Relieved by:  Nothing Ineffective treatments:  None tried Associated symptoms: abdominal pain   Associated symptoms: no fever and no vomiting     Past Medical History:  Diagnosis Date   Cirrhosis [facility])     Patient Active Problem List   Diagnosis Date Noted   Acute GI bleeding 07/26/2019   Abdominal discomfort 07/26/2019   Abdominal pain     History reviewed. No pertinent surgical history.      Home Medications    Prior to Admission medications   Medication Sig Start Date End Date Taking? Authorizing Provider  acetaminophen (TYLENOL) 325 MG tablet Take 650 mg by mouth every 6 (six) hours as needed for mild pain, moderate pain or headache.    [provider]  folic acid (FOLVITE) 1 MG tablet Take 1 mg by mouth daily. 07/27/19   [provider]  ondansetron (ZOFRAN) 8 MG tablet Take 8 mg by mouth every 8 (eight) hours as needed for nausea/vomiting. 08/06/19   [provider]  pantoprazole (PROTONIX) 40 MG tablet Take 40 mg by mouth daily. 08/06/19   [provider]  Thiamine HCl (VITAMIN B1) 100 MG TABS Take 1 tablet by mouth daily. 07/27/19   [provider]    Family History Family History  Problem Relation Age of Onset   Diabetes Mellitus II Neg Hx     Social History Social History   Tobacco Use   Smoking status: Current Every Day Smoker   Smokeless tobacco: Never Used  Substance Use Topics   Alcohol use: Not Currently    Comment: quit two weeks ago   Drug use: Not on file     Allergies   Patient has no known allergies.   Review of Systems Review of Systems  Constitutional: Negative for chills and fever.  HENT: Negative for congestion and facial swelling.   Eyes: Negative for discharge and visual disturbance.  Respiratory: Negative for shortness of breath.   Cardiovascular: Negative for chest pain and palpitations.  Gastrointestinal: Positive for abdominal pain, blood in stool and hematochezia. Negative for diarrhea and vomiting.  Musculoskeletal: Negative for arthralgias and myalgias.  Skin: Negative for color change and rash.  Neurological: Negative for tremors, syncope and headaches.  Psychiatric/Behavioral: Negative for confusion and dysphoric mood.     Physical Exam Updated Vital Signs BP 104/76    Pulse 95  Temp 98.8 F (37.1 C) (Oral)    Resp (!) 24    Wt 73.3 kg    SpO2 95%    BMI 21.92 kg/m   Physical Exam Vitals signs and nursing note reviewed.  Constitutional:      Appearance: He is well-developed.  HENT:     Head: Normocephalic and atraumatic.     Comments: Jaundiced Eyes:     Pupils: Pupils are equal, round, and reactive to light.  Neck:     Musculoskeletal: Normal range of motion and neck supple.     Vascular: No JVD.  Cardiovascular:     Rate and Rhythm: Normal rate and regular rhythm.     Heart sounds: No murmur. No friction rub. No gallop.   Pulmonary:     Effort: No  respiratory distress.     Breath sounds: No wheezing.  Abdominal:     General: There is distension ( With positive fluid wave.).     Tenderness: There is no abdominal tenderness. There is no guarding or rebound.     Comments: No appreciable tenderness on exam.  Genitourinary:    Comments: Gross melena Musculoskeletal: Normal range of motion.  Skin:    Coloration: Skin is not pale.     Findings: No rash.  Neurological:     Mental Status: He is alert and oriented to person, place, and time.  Psychiatric:        Behavior: Behavior normal.      ED Treatments / Results  Labs (all labs ordered are listed, but only abnormal results are displayed) Labs Reviewed  COMPREHENSIVE METABOLIC PANEL - Abnormal; Notable for the following components:      Result Value   CO2 21 (*)    Glucose, Bld 122 (*)    BUN 34 (*)    Albumin 2.7 (*)    AST 143 (*)    ALT 117 (*)    Alkaline Phosphatase 500 (*)    Total Bilirubin 1.8 (*)    All other components within normal limits  CBC - Abnormal; Notable for the following components:   WBC 14.7 (*)    RBC 2.97 (*)    Hemoglobin 10.4 (*)    HCT 32.2 (*)    MCV 108.4 (*)    MCH 35.0 (*)    RDW 16.5 (*)    All other components within normal limits  SARS CORONAVIRUS 2 (HOSPITAL ORDER, [place] [facility])  PROTIME-INR  POC OCCULT BLOOD, ED  TYPE AND SCREEN    EKG None  Radiology No results found.  Procedures Procedures (including critical care time)  Medications Ordered in ED Medications  pantoprazole (PROTONIX) 80 mg in sodium chloride 0.9 % 250 mL (0.32 mg/mL) infusion (8 mg/hr Intravenous New Bag/Given 08/23/19 1435)  pantoprazole (PROTONIX) injection 40 mg (has no administration in time range)  octreotide (SANDOSTATIN) 2 mcg/mL load via infusion 50 mcg (50 mcg Intravenous Bolus from Bag 08/23/19 1348)    And  octreotide (SANDOSTATIN) 500 mcg in sodium chloride 0.9 % 250 mL (2 mcg/mL) infusion (50 mcg/hr Intravenous  New Bag/Given 08/23/19 1348)  0.9 %  sodium chloride infusion (has no administration in time range)  pantoprazole (PROTONIX) 80 mg in sodium chloride 0.9 % 100 mL IVPB (0 mg Intravenous Stopped 08/23/19 1430)  cefTRIAXone (ROCEPHIN) 1 g in sodium chloride 0.9 % 100 mL IVPB (0 g Intravenous Stopped 08/23/19 1340)  ondansetron (ZOFRAN) injection 4 mg (4 mg Intravenous Given 08/23/19 1405)  Initial Impression / Assessment and Plan / ED Course  I have reviewed the triage vital signs and the nursing notes.  Pertinent labs & imaging results that were available during my care of the patient were reviewed by me and considered in my medical decision making (see chart for details).        58 yo M with a chief complaints of GI bleeding.  Likely upper GI bleed based on history and physical.  Patient unfortunately has a history of alcohol cirrhosis.  Patient also has known varices.  Started on octreotide Rocephin Protonix drip.  Case discussed with Dr. Havery Moros, lobe our GI will come and evaluate the patient.  He had one episode of hematemesis.  Continues to have stable blood pressures.  Tachycardia has improved with IV fluids.  CRITICAL CARE Performed by: Cecilio Asper   Total critical care time: 35 minutes  Critical care time was exclusive of separately billable procedures and treating other patients.  Critical care was necessary to treat or prevent imminent or life-threatening deterioration.  Critical care was time spent personally by me on the following activities: development of treatment plan with patient and/or surrogate as well as nursing, discussions with consultants, evaluation of patient's response to treatment, examination of patient, obtaining history from patient or surrogate, ordering and performing treatments and interventions, ordering and review of laboratory studies, ordering and review of radiographic studies, pulse oximetry and re-evaluation of patient's condition.  The  patients results and plan were reviewed and discussed.   Any x-rays performed were independently reviewed by myself.   Differential diagnosis were considered with the presenting HPI.  Medications  pantoprazole (PROTONIX) 80 mg in sodium chloride 0.9 % 250 mL (0.32 mg/mL) infusion (8 mg/hr Intravenous New Bag/Given 08/23/19 1435)  pantoprazole (PROTONIX) injection 40 mg (has no administration in time range)  octreotide (SANDOSTATIN) 2 mcg/mL load via infusion 50 mcg (50 mcg Intravenous Bolus from Bag 08/23/19 1348)    And  octreotide (SANDOSTATIN) 500 mcg in sodium chloride 0.9 % 250 mL (2 mcg/mL) infusion (50 mcg/hr Intravenous New Bag/Given 08/23/19 1348)  0.9 %  sodium chloride infusion (has no administration in time range)  pantoprazole (PROTONIX) 80 mg in sodium chloride 0.9 % 100 mL IVPB (0 mg Intravenous Stopped 08/23/19 1430)  cefTRIAXone (ROCEPHIN) 1 g in sodium chloride 0.9 % 100 mL IVPB (0 g Intravenous Stopped 08/23/19 1340)  ondansetron (ZOFRAN) injection 4 mg (4 mg Intravenous Given 08/23/19 1405)    Vitals:   08/23/19 1214 08/23/19 1440  BP: 117/79 104/76  Pulse: (!) 114 95  Resp: 17 (!) 24  Temp: 98.8 F (37.1 C)   TempSrc: Oral   SpO2: 97% 95%  Weight: 73.3 kg     Final diagnoses:  Acute GI bleeding    Admission/ observation were discussed with the admitting physician, patient and/or family and they are comfortable with the plan.    Final Clinical Impressions(s) / ED Diagnoses   Final diagnoses:  Acute GI bleeding    ED Discharge Orders    None       Deno Etienne, DO 08/23/19 1512

## 2019-08-23 NOTE — Progress Notes (Addendum)
CSW received consult for patient regarding patient having questions about medicaid/insurance. CSW called patient's daughter [name] and let her know that patient will need to be seen by financial counseling and their next business day is Monday. CSW called Altamese Cabal with financial counseling to make her aware of this patient and also sent her a secure email with patient's daughter's information.  [place], LCSW Transitions of Care Department [facility] ED [phone]

## 2019-08-23 NOTE — ED Notes (Signed)
ED TO INPATIENT HANDOFF REPORT  ED Nurse Name and Phone #: Christinia Gully Name/Age/Gender Bobby Dixon 58 y.o. male Room/Bed: WLEN/NONE  Code Status   Code Status: Not on file  Home/SNF/Other Given to floor Patient oriented to: self, place, time and situation Is this baseline? Yes   Triage Complete: Triage complete  Chief Complaint Acute GI bleeding [K92.2]  Triage Note Pt having bright rectal bleeding since yesterday. Has ascites that is getting worse again, last drained 2 Fridays ago. End stage cirrhosis.    Allergies No Known Allergies  Level of Care/Admitting Diagnosis ED Disposition    ED Disposition Condition Comment   Admit  Hospital Area: [facility] [[id number]]  Level of Care: Med-Surg [16]  Covid Evaluation: Asymptomatic Screening Protocol (No Symptoms)  Diagnosis: GI bleed [id number]  Admitting Physician: [place], [place]  Attending Physician: Nita Sells [phone]  Estimated length of stay: 3 - 4 days  Certification:: I certify this patient will need inpatient services for at least 2 midnights  PT Class (Do Not Modify): Inpatient [101]  PT Acc Code (Do Not Modify): Private [1]       B Medical/Surgery History Past Medical History:  Diagnosis Date  . Cirrhosis ([place])    History reviewed. No pertinent surgical history.   A IV Location/Drains/Wounds Patient Lines/Drains/Airways Status   Active Line/Drains/Airways    Name:   Placement date:   Placement time:   Site:   Days:   Peripheral IV 08/23/19 Right Antecubital   08/23/19    1238    Antecubital   less than 1   Peripheral IV 08/23/19 Right Forearm   08/23/19    1329    Forearm   less than 1          Intake/Output Last 24 hours  Intake/Output Summary (Last 24 hours) at 08/23/2019 1636 Last data filed at 08/23/2019 1430 Gross per 24 hour  Intake 200 ml  Output -  Net 200 ml    Labs/Imaging Results for orders placed or performed during the hospital  encounter of 08/23/19 (from the past 48 hour(s))  Type and screen [place]     Status: None (Preliminary result)   Collection Time: 08/23/19 12:40 PM  Result Value Ref Range   ABO/RH(D) A POS    Antibody Screen NEG    Sample Expiration 08/26/2019,2359    Unit Number [id number]    Blood Component Type RED CELLS,LR    Unit division 00    Status of Unit ISSUED    Transfusion Status OK TO TRANSFUSE    Crossmatch Result Compatible    Unit Number [id number]    Blood Component Type RED CELLS,LR    Unit division 00    Status of Unit ISSUED    Transfusion Status OK TO TRANSFUSE    Crossmatch Result      Compatible Performed at [facility], [place] [address], [place], [place] [address]   Comprehensive metabolic panel     Status: Abnormal   Collection Time: 08/23/19  1:02 PM  Result Value Ref Range   Sodium 138 135 - 145 mmol/L   Potassium 4.6 3.5 - 5.1 mmol/L   Chloride 104 98 - 111 mmol/L   CO2 21 (L) 22 - 32 mmol/L   Glucose, Bld 122 (H) 70 - 99 mg/dL   BUN 34 (H) 6 - 20 mg/dL   Creatinine, Ser 0.85 0.61 - 1.24 mg/dL   Calcium 9.0 8.9 - 10.3  mg/dL   Total Protein 7.4 6.5 - 8.1 g/dL   Albumin 2.7 (L) 3.5 - 5.0 g/dL   AST 143 (H) 15 - 41 U/L   ALT 117 (H) 0 - 44 U/L   Alkaline Phosphatase 500 (H) 38 - 126 U/L   Total Bilirubin 1.8 (H) 0.3 - 1.2 mg/dL   GFR calc non Af Amer >60 >60 mL/min   GFR calc Af Amer >60 >60 mL/min   Anion gap 13 5 - 15    Comment: Performed at [facility], [place] [address], [place], [place] [address]  CBC     Status: Abnormal   Collection Time: 08/23/19  1:02 PM  Result Value Ref Range   WBC 14.7 (H) 4.0 - 10.5 K/uL   RBC 2.97 (L) 4.22 - 5.81 MIL/uL   Hemoglobin 10.4 (L) 13.0 - 17.0 g/dL   HCT 32.2 (L) 39.0 - 52.0 %   MCV 108.4 (H) 80.0 - 100.0 fL   MCH 35.0 (H) 26.0 - 34.0 pg   MCHC 32.3 30.0 - 36.0 g/dL   RDW 16.5 (H) 11.5 - 15.5 %   Platelets 218 150 - 400 K/uL   nRBC 0.0 0.0 - 0.2 %     Comment: Performed at [facility], [place] [address] [place], [place] [address]  Protime-INR     Status: None   Collection Time: 08/23/19  1:03 PM  Result Value Ref Range   Prothrombin Time 13.0 11.4 - 15.2 seconds   INR 1.0 0.8 - 1.2    Comment: (NOTE) INR goal varies based on device and disease states. Performed at [facility], [place] [address] [place], [place] [address]   SARS Coronavirus 2 [facility] order, Performed in [facility] hospital lab) Nasopharyngeal Nasopharyngeal Swab     Status: None   Collection Time: 08/23/19  2:50 PM   Specimen: Nasopharyngeal Swab  Result Value Ref Range   SARS Coronavirus 2 NEGATIVE NEGATIVE    Comment: (NOTE) If result is NEGATIVE SARS-CoV-2 target nucleic acids are NOT DETECTED. The SARS-CoV-2 RNA is generally detectable in upper and lower  respiratory specimens during the acute phase of infection. The lowest  concentration of SARS-CoV-2 viral copies this assay can detect is 250  copies / mL. A negative result does not preclude SARS-CoV-2 infection  and should not be used as the sole basis for treatment or other  patient management decisions.  A negative result may occur with  improper specimen collection / handling, submission of specimen other  than nasopharyngeal swab, presence of viral mutation(s) within the  areas targeted by this assay, and inadequate number of viral copies  (<250 copies / mL). A negative result must be combined with clinical  observations, patient history, and epidemiological information. If result is POSITIVE SARS-CoV-2 target nucleic acids are DETECTED. The SARS-CoV-2 RNA is generally detectable in upper and lower  respiratory specimens dur ing the acute phase of infection.  Positive  results are indicative of active infection with SARS-CoV-2.  Clinical  correlation with patient history and other diagnostic information is  necessary to determine patient infection status.   Positive results do  not rule out bacterial infection or co-infection with other viruses. If result is PRESUMPTIVE POSTIVE SARS-CoV-2 nucleic acids MAY BE PRESENT.   A presumptive positive result was obtained on the submitted specimen  and confirmed on repeat testing.  While 2019 novel coronavirus  (SARS-CoV-2) nucleic acids may be present in the submitted sample  additional confirmatory  testing may be necessary for epidemiological  and / or clinical management purposes  to differentiate between  SARS-CoV-2 and other Sarbecovirus currently known to infect humans.  If clinically indicated additional testing with an alternate test  methodology [id number]) is advised. The SARS-CoV-2 RNA is generally  detectable in upper and lower respiratory sp ecimens during the acute  phase of infection. The expected result is Negative. Fact Sheet for Patients:  StrictlyIdeas.no Fact Sheet for Healthcare Providers: BankingDealers.co.za This test is not yet approved or cleared by the [place] FDA and has been authorized for detection and/or diagnosis of SARS-CoV-2 by FDA under an Emergency Use Authorization (EUA).  This EUA will remain in effect (meaning this test can be used) for the duration of the COVID-19 declaration under Section 564(b)(1) of the Act, 21 U.S.C. section 360bbb-3(b)(1), unless the authorization is terminated or revoked sooner. Performed at [facility], [place] [address] [place], [place] [address]    No results found.  Pending Labs [employer] (From admission, onward)    Start     Ordered   Signed and Held  HIV antibody (Routine Testing)  Once,   R     Signed and Held   Signed and Held  Comprehensive metabolic panel  Tomorrow morning,   R     Signed and Held   Signed and Held  CBC  Tomorrow morning,   R     Signed and Held   Signed and Held  Protime-INR  Tomorrow morning,   R     Signed and Held           Vitals/Pain Today's Vitals   08/23/19 1212 08/23/19 1214 08/23/19 1440 08/23/19 1614  BP:  117/79 104/76 99/70  Pulse:  (!) 114 95 90  Resp:  17 (!) 24 (!) 24  Temp:  98.8 F (37.1 C)  98.8 F (37.1 C)  TempSrc:  Oral  Oral  SpO2:  97% 95% 92%  Weight:  73.3 kg  73.3 kg  Height:    6' (1.829 m)  PainSc: 8    0-No pain    Isolation Precautions No active isolations  Medications Medications  pantoprazole (PROTONIX) 80 mg in sodium chloride 0.9 % 250 mL (0.32 mg/mL) infusion (8 mg/hr Intravenous New Bag/Given 08/23/19 1435)  pantoprazole (PROTONIX) injection 40 mg ( Intravenous Automatically Held 09/04/19 2200)  octreotide (SANDOSTATIN) 2 mcg/mL load via infusion 50 mcg (50 mcg Intravenous Bolus from Bag 08/23/19 1348)    And  octreotide (SANDOSTATIN) 500 mcg in sodium chloride 0.9 % 250 mL (2 mcg/mL) infusion (50 mcg/hr Intravenous New Bag/Given 08/23/19 1348)  0.9 %  sodium chloride infusion (has no administration in time range)  cefTRIAXone (ROCEPHIN) 1 g in sodium chloride 0.9 % 100 mL IVPB ( Intravenous Automatically Held 08/31/19 1615)  pantoprazole (PROTONIX) 80 mg in sodium chloride 0.9 % 100 mL IVPB (0 mg Intravenous Stopped 08/23/19 1430)  cefTRIAXone (ROCEPHIN) 1 g in sodium chloride 0.9 % 100 mL IVPB (0 g Intravenous Stopped 08/23/19 1340)  ondansetron (ZOFRAN) injection 4 mg (4 mg Intravenous Given 08/23/19 1405)  propofol (DIPRIVAN) 10 mg/mL bolus/IV push (has no administration in time range)  fentaNYL (SUBLIMAZE) 100 MCG/2ML injection (has no administration in time range)    Mobility walks Low fall risk   Focused Assessments Cardiac Assessment Handoff:    No results found for: CKTOTAL, CKMB, CKMBINDEX, TROPONINI No results found for: DDIMER Does the Patient currently have chest pain? No   , Pulmonary  Assessment Handoff:  Lung sounds:   O2 Device: Room Air        R Recommendations: See Admitting Provider Note  Report given to:   Additional Notes:

## 2019-08-23 NOTE — Anesthesia Preprocedure Evaluation (Signed)
Anesthesia Evaluation  Patient identified by MRN, date of birth, ID band Patient awake    Reviewed: Allergy & Precautions, NPO status , Patient's Chart, lab work & pertinent test results  Airway Mallampati: II  TM Distance: >3 FB     Dental  (+) Dental Advisory Given   Pulmonary Current Smoker,    breath sounds clear to auscultation       Cardiovascular negative cardio ROS   Rhythm:Regular Rate:Normal     Neuro/Psych negative neurological ROS     GI/Hepatic (+) Cirrhosis       , GI bleed    Endo/Other  negative endocrine ROS  Renal/GU negative Renal ROS     Musculoskeletal   Abdominal   Peds  Hematology negative hematology ROS (+) anemia ,   Anesthesia Other Findings   Reproductive/Obstetrics                             Lab Results  Component Value Date   WBC 14.7 (H) 08/23/2019   HGB 10.4 (L) 08/23/2019   HCT 32.2 (L) 08/23/2019   MCV 108.4 (H) 08/23/2019   PLT 218 08/23/2019   Lab Results  Component Value Date   CREATININE 0.85 08/23/2019   BUN 34 (H) 08/23/2019   NA 138 08/23/2019   K 4.6 08/23/2019   CL 104 08/23/2019   CO2 21 (L) 08/23/2019    Anesthesia Physical Anesthesia Plan  ASA: III  Anesthesia Plan: General   Post-op Pain Management:    Induction: Intravenous and Rapid sequence  PONV Risk Score and Plan: 1 and Dexamethasone and Ondansetron  Airway Management Planned: Oral ETT  Additional Equipment:   Intra-op Plan:   Post-operative Plan: Extubation in OR  Informed Consent: I have reviewed the patients History and Physical, chart, labs and discussed the procedure including the risks, benefits and alternatives for the proposed anesthesia with the patient or authorized representative who has indicated his/her understanding and acceptance.     Dental advisory given  Plan Discussed with: CRNA  Anesthesia Plan Comments:          Anesthesia Quick Evaluation

## 2019-08-23 NOTE — ED Triage Notes (Signed)
Pt having bright rectal bleeding since yesterday. Has ascites that is getting worse again, last drained 2 Fridays ago. End stage cirrhosis.

## 2019-08-23 NOTE — Anesthesia Procedure Notes (Signed)
Procedure Name: Intubation Date/Time: 08/23/2019 4:35 PM Performed by: Lissa Morales, CRNA Pre-anesthesia Checklist: Patient identified, Emergency Drugs available, Suction available and Patient being monitored Patient Re-evaluated:Patient Re-evaluated prior to induction Oxygen Delivery Method: Circle system utilized Preoxygenation: Pre-oxygenation with 100% oxygen Induction Type: IV induction Ventilation: Mask ventilation without difficulty Laryngoscope Size: Mac and 4 Grade View: Grade I Tube type: Oral Tube size: 7.5 mm Number of attempts: 1 Airway Equipment and Method: Stylet and Oral airway Placement Confirmation: ETT inserted through vocal cords under direct vision,  positive ETCO2 and breath sounds checked- equal and bilateral Secured at: 21 cm Tube secured with: Tape Dental Injury: Teeth and Oropharynx as per pre-operative assessment

## 2019-08-23 NOTE — H&P (Signed)
HPI  Bobby Dixon FWY:[id number] DOB: 1961-01-02 DOA: 08/23/2019  PCP: System, Pcp Not In   Chief Complaint: Bloody emesis, dark stool  HPI:  58 year old male known EtOH, recent diagnosis ethanol cirrhosis, recent paracentesis 9/4, multiple orthopedic issues-apparently has had endoscopy done showing moderate varices in the past Comes to emergency room 08/23/2019 multiple episodes of dark stool since this morning eight 9/12-has been taking some Aleve for the past 3 to 4 days for belly pain 1 hour prior to coming to the hospital had BRB emesis has not had this ever before Quit drinking 6 to 7 weeks ago as was not feeling well has been drinking heavily for the past 40 years Still smoker  In the emergency room he had another episode of bloody emesis  Review of Systems:   Pertinent +'s: Bloody emesis, bloody stool multiple episodes, no chest pain, no fever, no chills, no blurred or double vision, no nausea at this time  Tells me belly is more swollen than usual despite having paracentesis recently  ED Course: Room 1 dose of ceftriaxone started on octreotide started on PPI GTT, started on normal saline 50 cc/H   Past Medical History:  Diagnosis Date  . Cirrhosis ([place])    History reviewed. No pertinent surgical history.  reports that he has been smoking. He has never used smokeless tobacco. He reports previous alcohol use. No history on file for drug.  Mobility: Independent at baseline used to work as a Dealer Long history of smoking and drinking, still smokes pipe equivalent 1 pack/day  No Known Allergies Family History  Problem Relation Age of Onset  . Diabetes Mellitus II Neg Hx    Prior to Admission medications   Medication Sig Start Date End Date Taking? Authorizing Provider  acetaminophen (TYLENOL) 325 MG tablet Take 650 mg by mouth every 6 (six) hours as needed for mild pain, moderate pain or headache.    [provider]  folic acid (FOLVITE) 1 MG tablet Take 1  mg by mouth daily. 07/27/19   [provider]  ondansetron (ZOFRAN) 8 MG tablet Take 8 mg by mouth every 8 (eight) hours as needed for nausea/vomiting. 08/06/19   [provider]  pantoprazole (PROTONIX) 40 MG tablet Take 40 mg by mouth daily. 08/06/19   [provider]  Thiamine HCl (VITAMIN B1) 100 MG TABS Take 1 tablet by mouth daily. 07/27/19   [provider]    Physical Exam:  Vitals:   08/23/19 1214 08/23/19 1440  BP: 117/79 104/76  Pulse: (!) 114 95  Resp: 17 (!) 24  Temp: 98.8 F (37.1 C)   SpO2: 97% 95%     Awake alert coherent no icterus no pallor well-groomed looking about stated age  Throat clear soft supple neck no thyromegaly  Chest clinically clear no added sound  Epigastric tenderness noted  Mild clubbing of hands noted  Shifting dullness + with turning the patient  No lower extremity edema  ROM to most muscle groups is intact  Neurologically intact  Euthymic congruent pleasant  I have personally reviewed following labs and imaging studies  Labs:   Sodium 138 potassium 4.6 bicarb 21  BUN/creatinine bumped to 35/0.85, prior was 16/0.7  AST/ALT elevated 143/117 >normal of 01/01/1977 respectively  Total bilirubin is 1.8 currently  Alk phos is 500 anion gap is normal  White count is 14.7 normal is 9  Hemoglobin down from 13.1-->10.4 MCV 108  Platelets 218  Coronavirus 19 test is negative  Imaging  studies:   None performed  Medical tests:   EKG independently reviewed: None performed  Test discussed with performing physician:  Yes discussed with Dr. Tyrone Nine  Decision to obtain old records:   Yes  Review and summation of old records:   Yes  Active Problems:   GI bleed   Assessment/Plan Probable upper GI bleed worsened by NSAID use in the setting of known varices Defer planning to gastroenterology-saline 50 cc/H Decompensated cirrhosis Patient has shifting dullness, will get nonemergent  paracentesis once EGD can be done-defer fluid management in addition to diuretics to gastroenterology, get c-Met INR in a.m. Further risk stratification consider outpatient Continue at this time ceftriaxone Continued smoker Counseled, give nicotine patch Prerenal azotemia secondary to acute GI bleed Once again saline at 50 cc an hour Hesitate to diurese at this time   Severity of Illness: The appropriate patient status for this patient is INPATIENT. Inpatient status is judged to be reasonable and necessary in order to provide the required intensity of service to ensure the patient's safety. The patient's presenting symptoms, physical exam findings, and initial radiographic and laboratory data in the context of their chronic comorbidities is felt to place them at high risk for further clinical deterioration. Furthermore, it is not anticipated that the patient will be medically stable for discharge from the hospital within 2 midnights of admission. The following factors support the patient status of inpatient.   " The patient's presenting symptoms include GI bleed. " The worrisome physical exam findings include epigastric tenderness. " The initial radiographic and laboratory data are worrisome because of anemia. " The chronic co-morbidities include drinker.   * I certify that at the point of admission it is my clinical judgment that the patient will require inpatient hospital care spanning beyond 2 midnights from the point of admission due to high intensity of service, high risk for further deterioration and high frequency of surveillance required.*    DVT prophylaxis: SCD Code Status: DNR confirmed at the bedside with patient Family Communication: Talk to daughter Consults called: Gastroenterology  Time spent: 52 minutes  Verlon Au, MD Jerl Mina my NP partners at night for Care related issues] [facility] --[employer] [employer] [place] , [email or web address]; password [facility]  08/23/2019, 3:52 PM

## 2019-08-23 NOTE — Consult Note (Signed)
Consultation  Referring Provider:     ED - Dr. Tyrone Nine Primary Care Physician:  System, Pcp Not In Primary Gastroenterologist:        None yet - was supposed to see [facility] as outpatient, they declined this inpatient consult Reason for Consultation:     GI bleed         HPI:   Bobby Dixon is a 58 y.o. male with a history of alcoholic cirrhosis, admitted to the hospital for a GI bleed.  He was relatively recently diagnosed with cirrhosis of the liver about 1.5 months ago per his report. He was admitted to [facility] last month with GI bleeding. He had an endoscopy done by Dr. Cristina Gong who noted moderate esophageal varices and some gastric erosions with distal esophagus esophagitis. It was thought that the patient likely did NOT have a variceal bleed at the time. He was treated with PPI and discharged. He had scheduled a follow up appointment with Dr. Cristina Gong for October. [facility] was notified of the admission of this patient but declined to do the consult and our practice was called. (Of note, patient has 2 charts in the system under his name with different birth dates - thus records of last EGD not in this Epic chart)  He reports he has drank about 6-8 beers per day for 40 years. He has stopped since his hospitalization in October. Daughter present and states he has been sober during this time. He has developed issues with ascites since this time. Was seen in the ED on 9/4 and had a paracentesis done, negative for SBP and sent home. He is not taking prophylaxis for varices. He has been taking Aleve for joint pains recently. He developed multiple episodes of dark / red blood stools yesterday, daughter states about 15 episodes. He denies any pain. He then developed hematemesis in the ED upon presentation, vomited 1/2 cup of bright red blood into the emesis bag. Given octreotide and protonix IV in the ED.   HP 90s, BP 104/76.   He endorses a history of tobacco use. He denies other medical  problems although has not saught medical care. He does not have insurance.   He did have an US of the liver on 07/26/19 showing cirrhosis without mass lesions.  Past Medical History:  Diagnosis Date  . Cirrhosis ([place])   esophageal varices gastrittis  History reviewed. No pertinent surgical history.  Family History  Problem Relation Age of Onset  . Diabetes Mellitus II Neg Hx      Social History   Tobacco Use  . Smoking status: Current Every Day Smoker  . Smokeless tobacco: Never Used  Substance Use Topics  . Alcohol use: Not Currently    Comment: quit two weeks ago  . Drug use: Not on file    Prior to Admission medications   Medication Sig Start Date End Date Taking? Authorizing Provider  acetaminophen (TYLENOL) 325 MG tablet Take 650 mg by mouth every 6 (six) hours as needed for mild pain, moderate pain or headache.    [provider]  folic acid (FOLVITE) 1 MG tablet Take 1 mg by mouth daily. 07/27/19   [provider]  ondansetron (ZOFRAN) 8 MG tablet Take 8 mg by mouth every 8 (eight) hours as needed for nausea/vomiting. 08/06/19   [provider]  pantoprazole (PROTONIX) 40 MG tablet Take 40 mg by mouth daily. 08/06/19   [provider]  Thiamine HCl (VITAMIN B1)  100 MG TABS Take 1 tablet by mouth daily. 07/27/19   [provider]    Current Facility-Administered Medications  Medication Dose Route Frequency Provider Last Rate Last Dose  . octreotide (SANDOSTATIN) 500 mcg in sodium chloride 0.9 % 250 mL (2 mcg/mL) infusion  50 mcg/hr Intravenous Continuous Deno Etienne, DO 25 mL/hr at 08/23/19 1348 50 mcg/hr at 08/23/19 1348  . pantoprazole (PROTONIX) 80 mg in sodium chloride 0.9 % 250 mL (0.32 mg/mL) infusion  8 mg/hr Intravenous Continuous Deno Etienne, DO 25 mL/hr at 08/23/19 1435 8 mg/hr at 08/23/19 1435  . [START ON 08/27/2019] pantoprazole (PROTONIX) injection 40 mg  40 mg Intravenous Q12H Deno Etienne, DO       Current Outpatient  Medications  Medication Sig Dispense Refill  . acetaminophen (TYLENOL) 325 MG tablet Take 650 mg by mouth every 6 (six) hours as needed for mild pain, moderate pain or headache.    . folic acid (FOLVITE) 1 MG tablet Take 1 mg by mouth daily.    . ondansetron (ZOFRAN) 8 MG tablet Take 8 mg by mouth every 8 (eight) hours as needed for nausea/vomiting.    . pantoprazole (PROTONIX) 40 MG tablet Take 40 mg by mouth daily.    . Thiamine HCl (VITAMIN B1) 100 MG TABS Take 1 tablet by mouth daily.      Allergies as of 08/23/2019  . (No Known Allergies)     Review of Systems:    As per HPI, otherwise negative    Physical Exam:  Vital signs in last 24 hours: Temp:  [98.8 F (37.1 C)] 98.8 F (37.1 C) (09/12 1214) Pulse Rate:  [95-114] 95 (09/12 1440) Resp:  [17-24] 24 (09/12 1440) BP: (104-117)/(76-79) 104/76 (09/12 1440) SpO2:  [95 %-97 %] 95 % (09/12 1440) Weight:  [73.3 kg] 73.3 kg (09/12 1214)   General:   Pleasant male Head:  Normocephalic and atraumatic. Eyes:   No icterus.   Conjunctiva pink. Ears:  Normal auditory acuity. Neck:  Supple Lungs:  Respirations even and unlabored. Lungs clear to auscultation bilaterally.    Heart:  Regular rate and rhythm; n Abdomen:  Soft, mildly distended, nontender.  No appreciable masses or hepatomegaly.  Rectal:  Not performed.  Msk:  Symmetrical without gross deformities.  Extremities:  Without edema. Neurologic:  Alert and  oriented x4;  grossly normal neurologically. Skin:  Intact without significant lesions or rashes. Psych:  Alert and cooperative. Normal affect.  LAB RESULTS: Recent Labs    08/23/19 1302  WBC 14.7*  HGB 10.4*  HCT 32.2*  PLT 218   BMET Recent Labs    08/23/19 1302  NA 138  K 4.6  CL 104  CO2 21*  GLUCOSE 122*  BUN 34*  CREATININE 0.85  CALCIUM 9.0   LFT Recent Labs    08/23/19 1302  PROT 7.4  ALBUMIN 2.7*  AST 143*  ALT 117*  ALKPHOS 500*  BILITOT 1.8*   PT/INR Recent Labs    08/23/19  1303  LABPROT 13.0  INR 1.0       Impression / Plan:   58 y/o male with a new diagnosis of alcoholic cirrhosis, complicated by ascites and esophageal varices, presenting with an upper GI bleed. He has both melena and hematemesis. He does have history of gastritis / gastric ulcer and recent NSAID use, but I'm concerned about possible variceal bleed based on review of his last EGD. Discussed my concerns with him. I'm recommending an EGD to further evaluate for  source and treat endoscopically if needed. If his COVID-19 test comes back in the near future and anesthesia is available, I am hoping to do this procedure this afternoon as he appears stable enough to tolerate it from anesthesia perspective. Please keep him NPO - he last ate at 5 AM and nothing since then. Otherwise if anesthesia is not available now,  we can do EGD in the morning or evening if he has worsening bleeding in the interim. I have discussed risks / benefits of endoscopy and anesthesia with them and they wish to proceed. If he does prove to have a variceal bleed, he may be a candidate for TIPS to prevent recurrence.  Recommend: - NPO - continue IV protonix and IV octreotide - antibiotics - recommend ceftriaxone in this patient with cirrhosis / bleeding / ascites for prophylaxis - EGD when anesthesia is available and when COVID-19 test returns, hopefully this afternoon - serial Hgb, transfuse to keep Hgb > 7 - will need complete alcohol abstinence moving forward - AFP for HCC screening - he also warrants additional labs to rule out other chronic liver disease etiology (viral hep, etc), which can be done this admission  Will follow, call with questions.  Hemingway Cellar, MD [facility] Gastroenterology

## 2019-08-23 NOTE — ED Notes (Signed)
GI admitting in room with pt

## 2019-08-24 ENCOUNTER — Inpatient Hospital Stay (HOSPITAL_COMMUNITY): Payer: Medicaid Other

## 2019-08-24 DIAGNOSIS — K922 Gastrointestinal hemorrhage, unspecified: Secondary | ICD-10-CM

## 2019-08-24 LAB — COMPREHENSIVE METABOLIC PANEL
ALT: 106 U/L — ABNORMAL HIGH (ref 0–44)
AST: 126 U/L — ABNORMAL HIGH (ref 15–41)
Albumin: 2.3 g/dL — ABNORMAL LOW (ref 3.5–5.0)
Alkaline Phosphatase: 390 U/L — ABNORMAL HIGH (ref 38–126)
Anion gap: 11 (ref 5–15)
BUN: 36 mg/dL — ABNORMAL HIGH (ref 6–20)
CO2: 20 mmol/L — ABNORMAL LOW (ref 22–32)
Calcium: 8.2 mg/dL — ABNORMAL LOW (ref 8.9–10.3)
Chloride: 109 mmol/L (ref 98–111)
Creatinine, Ser: 0.91 mg/dL (ref 0.61–1.24)
GFR calc Af Amer: >60 mL/min (ref >60)
GFR calc non Af Amer: >60 mL/min (ref >60)
Glucose, Bld: 151 mg/dL — ABNORMAL HIGH (ref 70–99)
Potassium: 5.2 mmol/L — ABNORMAL HIGH (ref 3.5–5.1)
Sodium: 140 mmol/L (ref 135–145)
Total Bilirubin: 1.6 mg/dL — ABNORMAL HIGH (ref 0.3–1.2)
Total Protein: 6.1 g/dL — ABNORMAL LOW (ref 6.5–8.1)

## 2019-08-24 LAB — LACTATE DEHYDROGENASE: LDH: 284 U/L — ABNORMAL HIGH (ref 98–192)

## 2019-08-24 LAB — CBC
HCT: 25.2 % — ABNORMAL LOW (ref 39.0–52.0)
HCT: 25.2 % — ABNORMAL LOW (ref 39.0–52.0)
Hemoglobin: 8 g/dL — ABNORMAL LOW (ref 13.0–17.0)
Hemoglobin: 8.1 g/dL — ABNORMAL LOW (ref 13.0–17.0)
MCH: 34.9 pg — ABNORMAL HIGH (ref 26.0–34.0)
MCH: 35.2 pg — ABNORMAL HIGH (ref 26.0–34.0)
MCHC: 31.7 g/dL (ref 30.0–36.0)
MCHC: 32.1 g/dL (ref 30.0–36.0)
MCV: 109.6 fL — ABNORMAL HIGH (ref 80.0–100.0)
MCV: 110 fL — ABNORMAL HIGH (ref 80.0–100.0)
Platelets: 149 10*3/uL — ABNORMAL LOW (ref 150–400)
Platelets: 154 10*3/uL (ref 150–400)
RBC: 2.29 MIL/uL — ABNORMAL LOW (ref 4.22–5.81)
RBC: 2.3 MIL/uL — ABNORMAL LOW (ref 4.22–5.81)
RDW: 16.7 % — ABNORMAL HIGH (ref 11.5–15.5)
RDW: 16.8 % — ABNORMAL HIGH (ref 11.5–15.5)
WBC: 12.1 10*3/uL — ABNORMAL HIGH (ref 4.0–10.5)
WBC: 9.2 10*3/uL (ref 4.0–10.5)
nRBC: 0 % (ref 0.0–0.2)
nRBC: 0 % (ref 0.0–0.2)

## 2019-08-24 LAB — GRAM STAIN

## 2019-08-24 LAB — BODY FLUID CELL COUNT WITH DIFFERENTIAL
Lymphs, Fluid: 55 %
Monocyte-Macrophage-Serous Fluid: 40 % — ABNORMAL LOW (ref 50–90)
Neutrophil Count, Fluid: 5 % (ref 0–25)
Total Nucleated Cell Count, Fluid: 230 cu mm (ref 0–1000)

## 2019-08-24 LAB — BASIC METABOLIC PANEL
Anion gap: 9 (ref 5–15)
BUN: 39 mg/dL — ABNORMAL HIGH (ref 6–20)
CO2: 21 mmol/L — ABNORMAL LOW (ref 22–32)
Calcium: 7.9 mg/dL — ABNORMAL LOW (ref 8.9–10.3)
Chloride: 109 mmol/L (ref 98–111)
Creatinine, Ser: 0.85 mg/dL (ref 0.61–1.24)
GFR calc Af Amer: >60 mL/min (ref >60)
GFR calc non Af Amer: >60 mL/min (ref >60)
Glucose, Bld: 127 mg/dL — ABNORMAL HIGH (ref 70–99)
Potassium: 4.8 mmol/L (ref 3.5–5.1)
Sodium: 139 mmol/L (ref 135–145)

## 2019-08-24 LAB — HEMOGLOBIN AND HEMATOCRIT, BLOOD
HCT: 26.2 % — ABNORMAL LOW (ref 39.0–52.0)
Hemoglobin: 8.3 g/dL — ABNORMAL LOW (ref 13.0–17.0)

## 2019-08-24 LAB — HIV ANTIBODY (ROUTINE TESTING W REFLEX): HIV Screen 4th Generation wRfx: NONREACTIVE

## 2019-08-24 LAB — PROTIME-INR
INR: 1.1 (ref 0.8–1.2)
Prothrombin Time: 13.7 seconds (ref 11.4–15.2)

## 2019-08-24 LAB — GLUCOSE, CAPILLARY: Glucose-Capillary: 120 mg/dL — ABNORMAL HIGH (ref 70–99)

## 2019-08-24 MED ORDER — SODIUM CHLORIDE (PF) 0.9 % IJ SOLN
INTRAMUSCULAR | Status: AC
  Filled 2019-08-24: qty 50

## 2019-08-24 MED ORDER — ALBUMIN HUMAN 25 % IV SOLN
25.0000 g | Freq: Four times a day (QID) | INTRAVENOUS | Status: AC
  Administered 2019-08-24 (×2): 25 g via INTRAVENOUS
  Filled 2019-08-24 (×2): qty 100

## 2019-08-24 MED ORDER — IOHEXOL 300 MG/ML  SOLN: 100.0000 mL | Freq: Once | INTRAMUSCULAR | Status: DC | PRN

## 2019-08-24 MED ORDER — LIDOCAINE HCL 1 % IJ SOLN
INTRAMUSCULAR | Status: AC
  Administered 2019-08-24: 12:00:00
  Filled 2019-08-24: qty 10

## 2019-08-24 NOTE — Progress Notes (Addendum)
When I arrived to take patient to STAT CT for TIPS work-up, he and his daughter notified me that he does not want to move forward with the TIPS procedure. Notified Dr. Havery Moros, MD. Will come to bedside to speak with patient and daughter.

## 2019-08-24 NOTE — Procedures (Signed)
PROCEDURE SUMMARY:  Successful US guided paracentesis from right lateral abdomen.  Yielded 3.8 liters of clear yellow fluid.  No immediate complications.  Patient tolerated well.  EBL = trace  Specimen was sent for labs.  Alizah Sills S Damian Buckles PA-C 08/24/2019 1:32 PM

## 2019-08-24 NOTE — H&P (Signed)
Chief Complaint: Ascites secondary to alcoholic cirrhosis  Referring Physician(s): Armbruster Samtani  Supervising Physician: Daryll Brod  Patient Status: [facility] - [facility]  History of Present Illness: Bobby Dixon is a 58 y.o. male with ascites secondary to alcoholic cirrhosis.  He is known to our service for recent paracentesis on 08/15/2019 with 3.6 liters removed.  He has had endoscopy in the past which showed moderate varices.  He presented to the ED on 08/23/2019 c/o multiple episodes of dark stool.  He also had an episode of bloody emesis prior to arriving to the hospital.  Per is daughter and sister, he quit drinking 6 to 7 weeks ago because he has not been feeling well.  His sister states she feels he has significantly gone downhill since July when he suffered what she thinks was a heat stroke, or heat exhaustion.  We are asked to evaluate him for TIPS procedure.  His MELD is 9 and Child- Pugh is B  Past Medical History:  Diagnosis Date  . Cirrhosis ([place])     History reviewed. No pertinent surgical history.  Allergies: Patient has no known allergies.  Medications: Prior to Admission medications   Medication Sig Start Date End Date Taking? Authorizing Provider  acetaminophen (TYLENOL) 325 MG tablet Take 650 mg by mouth every 6 (six) hours as needed for mild pain, moderate pain or headache.   Yes [provider]  folic acid (FOLVITE) 1 MG tablet Take 1 mg by mouth daily. 07/27/19  Yes [provider]  ondansetron (ZOFRAN) 8 MG tablet Take 8 mg by mouth every 8 (eight) hours as needed for nausea/vomiting. 08/06/19  Yes [provider]  pantoprazole (PROTONIX) 40 MG tablet Take 40 mg by mouth daily. 08/06/19  Yes [provider]  Thiamine HCl (VITAMIN B1) 100 MG TABS Take 1 tablet by mouth daily. 07/27/19  Yes [provider]     Family History  Problem Relation Age of Onset  . Diabetes Mellitus II Neg Hx     Social  History   Socioeconomic History  . Marital status: Married    Spouse name: Not on file  . Number of children: Not on file  . Years of education: Not on file  . Highest education level: Not on file  Occupational History  . Not on file  Social Needs  . Financial resource strain: Not on file  . Food insecurity    Worry: Not on file    Inability: Not on file  . Transportation needs    Medical: Not on file    Non-medical: Not on file  Tobacco Use  . Smoking status: Current Every Day Smoker  . Smokeless tobacco: Never Used  Substance and Sexual Activity  . Alcohol use: Not Currently    Comment: quit two weeks ago  . Drug use: Not on file  . Sexual activity: Not on file  Lifestyle  . Physical activity    Days per week: Not on file    Minutes per session: Not on file  . Stress: Not on file  Relationships  . Social Herbalist on phone: Not on file    Gets together: Not on file    Attends religious service: Not on file    Active member of club or organization: Not on file    Attends meetings of clubs or organizations: Not on file    Relationship status: Not on file  Other Topics Concern  . Not on file  Social History Narrative  . Not on file     Review of Systems: A 12 point ROS discussed and pertinent positives are indicated in the HPI above.  All other systems are negative.  Review of Systems  Vital Signs: BP 106/72   Pulse 62   Temp 98.1 F (36.7 C) (Oral)   Resp (!) 21   Ht 6' (1.829 m)   Wt 72.8 kg   SpO2 99%   BMI 21.77 kg/m   Physical Exam Vitals signs reviewed.  Constitutional:      Appearance: Normal appearance.  Eyes:     Extraocular Movements: Extraocular movements intact.  Neck:     Musculoskeletal: Normal range of motion.  Cardiovascular:     Rate and Rhythm: Normal rate and regular rhythm.  Pulmonary:     Effort: Pulmonary effort is normal. No respiratory distress.     Breath sounds: Normal breath sounds.  Abdominal:      General: There is distension.     Comments: Undergoing paracentesis today  Musculoskeletal: Normal range of motion.  Skin:    General: Skin is warm and dry.  Neurological:     General: No focal deficit present.     Mental Status: He is alert and oriented to person, place, and time.  Psychiatric:        Mood and Affect: Mood normal.        Behavior: Behavior normal.        Thought Content: Thought content normal.        Judgment: Judgment normal.     Imaging: US Paracentesis  Result Date: 08/15/2019 INDICATION: Patient with history of abdominal pain/distension, ascites. Request made for diagnostic and therapeutic paracentesis. EXAM: ULTRASOUND GUIDED DIAGNOSTIC AND THERAPEUTIC PARACENTESIS MEDICATIONS: None COMPLICATIONS: None immediate. PROCEDURE: Informed written consent was obtained from the patient after a discussion of the risks, benefits and alternatives to treatment. A timeout was performed prior to the initiation of the procedure. Initial ultrasound scanning demonstrates a moderate amount of ascites within the right lower abdominal quadrant. The right lower abdomen was prepped and draped in the usual sterile fashion. 1% lidocaine was used for local anesthesia. Following this, a 19 gauge, 7-cm, Yueh catheter was introduced. An ultrasound image was saved for documentation purposes. The paracentesis was performed. The catheter was removed and a dressing was applied. The patient tolerated the procedure well without immediate post procedural complication. FINDINGS: A total of approximately 3.6 liters of yellow fluid was removed. Samples were sent to the laboratory as requested by the clinical team. IMPRESSION: Successful ultrasound-guided diagnostic and therapeutic paracentesis yielding 3.6 liters of peritoneal fluid. Read by: Rowe Robert, PA-C Electronically Signed   By: Markus Daft M.D.   On: 08/15/2019 15:54   Dg Chest Portable 1 View  Result Date: 08/15/2019 CLINICAL DATA:  Dyspnea EXAM:  PORTABLE CHEST 1 VIEW COMPARISON:  07/26/2019 FINDINGS: The heart size and mediastinal contours are within normal limits. Redemonstrated elevation of the left hemidiaphragm with associated atelectasis or scarring. There may be a small left pleural effusion. The visualized skeletal structures are unremarkable. IMPRESSION: Redemonstrated elevation of the left hemidiaphragm with associated atelectasis or scarring. There may be a small left pleural effusion. No definite acute airspace opacity. PA and lateral radiographs may be helpful to further evaluate. Electronically Signed   By: Eddie Candle M.D.   On: 08/15/2019 13:30    Labs:  CBC: Recent Labs    08/15/19 1225 08/23/19 1302 08/23/19 1907 08/24/19 0009 08/24/19 0641  WBC  9.4 14.7*  --  9.2 12.1*  HGB 13.1 10.4* 8.8* 8.1* 8.0*  HCT 39.9 32.2* 27.9* 25.2* 25.2*  PLT 196 218  --  154 149*    COAGS: Recent Labs    08/15/19 1225 08/23/19 1303 08/24/19 0009  INR 1.0 1.0 1.1    BMP: Recent Labs    08/15/19 1225 08/23/19 1302 08/24/19 0009 08/24/19 0641  NA 139 138 140 139  K 4.1 4.6 5.2* 4.8  CL 105 104 109 109  CO2 23 21* 20* 21*  GLUCOSE 88 122* 151* 127*  BUN 16 34* 36* 39*  CALCIUM 9.3 9.0 8.2* 7.9*  CREATININE 0.70 0.85 0.91 0.85  GFRNONAA >60 >60 >60 >60  GFRAA >60 >60 >60 >60    LIVER FUNCTION TESTS: Recent Labs    08/15/19 1225 08/23/19 1302 08/24/19 0009  BILITOT 1.6* 1.8* 1.6*  AST 122* 143* 126*  ALT 78* 117* 106*  ALKPHOS 487* 500* 390*  PROT 7.8 7.4 6.1*  ALBUMIN 2.9* 2.7* 2.3*    TUMOR MARKERS: No results for input(s): AFPTM, CEA, CA199, CHROMGRNA in the last 8760 hours.  Assessment and Plan:  Ascites secondary to alcoholic liver disease.  MELD 9, Child-Pugh B  Will obtain CT scan with BRTO protocol and 2D ECHO to evaluate for heart failure.  If anatomy is amenable and no heart failure on ECHO, will plan TIPS later in week at [facility].  If approved, patient will need transfer to  [facility] for procedure.  Thank you for this interesting consult.  I greatly enjoyed meeting Jaxzen Entin Mallinger and look forward to participating in their care.  A copy of this report was sent to the requesting provider on this date.  Electronically Signed: Murrell Redden, PA-C   08/24/2019, 12:09 PM      I spent a total of   25 Minutes in face to face in clinical consultation, greater than 50% of which was counseling/coordinating care for TIPS consult.

## 2019-08-24 NOTE — Progress Notes (Addendum)
Progress Note   Subjective  Patient did okay overnight. Passed some dark stool. Hgb has drifted from 8.8 to 8.1 but stable around 8.0 this AM. BUN remains elevated. He has some tightness from the banding yesterday but otherwise feels okay. Vitals stable.   Objective   Vital signs in last 24 hours: Temp:  [97.6 F (36.4 C)-98.8 F (37.1 C)] 98.1 F (36.7 C) (09/13 0814) Pulse Rate:  [66-114] 66 (09/13 1100) Resp:  [17-27] 20 (09/13 1100) BP: (97-137)/(66-94) 102/80 (09/13 1100) SpO2:  [92 %-100 %] 100 % (09/13 1100) Weight:  [72.8 kg-73.3 kg] 72.8 kg (09/12 1749) Last BM Date: 08/23/19 General:    white male in NAD Heart:  Regular rate and rhythm;  Lungs: Respirations even and unlabored, lungs CTA bilaterally Abdomen:  Soft, nontender and nondistended.  Extremities:  Without edema. Neurologic:  Alert and oriented,  grossly normal neurologically. Psych:  Cooperative. Normal mood and affect.  Intake/Output from previous day: 09/12 0701 - 09/13 0700 In: 1705.7 [I.V.:1505.7; IV Piggyback:200] Out: 50 [Blood:50] Intake/Output this shift: Total I/O In: 846.9 [I.V.:746.9; IV Piggyback:100] Out: 350 [Urine:350]  Lab Results: Recent Labs    08/23/19 1302 08/23/19 1907 08/24/19 0009 08/24/19 0641  WBC 14.7*  --  9.2 12.1*  HGB 10.4* 8.8* 8.1* 8.0*  HCT 32.2* 27.9* 25.2* 25.2*  PLT 218  --  154 149*   BMET Recent Labs    08/23/19 1302 08/24/19 0009 08/24/19 0641  NA 138 140 139  K 4.6 5.2* 4.8  CL 104 109 109  CO2 21* 20* 21*  GLUCOSE 122* 151* 127*  BUN 34* 36* 39*  CREATININE 0.85 0.91 0.85  CALCIUM 9.0 8.2* 7.9*   LFT Recent Labs    08/24/19 0009  PROT 6.1*  ALBUMIN 2.3*  AST 126*  ALT 106*  ALKPHOS 390*  BILITOT 1.6*   PT/INR Recent Labs    08/23/19 1303 08/24/19 0009  LABPROT 13.0 13.7  INR 1.0 1.1    Studies/Results: No results found.     Assessment / Plan:   58 y/o male with a new diagnosis of alcoholic cirrhosis in recent  months, complicated by ascites and esophageal varices, who presented with his second upper GI bleed in the past 2 months. EGD yesterday showed large esophageal varices with red whale signs, no other pathology to cause his bleeding. Varices were banded x 6. In retrospect it is possible he also bled from varices during his prior hospitalization, unclear as he also had small ulcers.  His Hgb has drifted down and BUN remains elevated, it's possible he is still having some low level bleeding. Will repeat Hgb today and consider repeat EGD if concern for rebleeding. He's otherwise decompensated quickly over the past 2 months despite abstaining from alcohol. He may benefit from early TIPS to prevent recurrent variceal bleeding, there is good data to support this in certain situations, and I have asked IR to evaluate him to consider this. They have recommended imaging BRTO / TIPS protocol and Echo which is pending.    If his Hgb remains stable today can advance to clears, will await next blood draw. Continue octreotide / protonix otherwise, as well as ceftriaxone. He warrants labs to rule out other causes of chronic liver diseases while he is here. Recent US has been done (information of [place] and prior EGD in his other Epic chart - he has multiple charts due to wrong date of birth listed), no evidence of [place] but will  await CT imaging today.   Will follow, call with questions.  Damascus Cellar, MD [facility]    ADDENDUM: Repeat Hgb stable, no further bleeding symptoms, started on clears. Spoke with IR, they think he is a good candidate for early TIPS. Family called me back in the room, spoke with daughter and patient at length. He does not want TIPS, he does not want to be in the hospital, wants to go home. He feels hopeless that nothing will get better, he does not want further evaluation for his liver disease or to consider transplant evaluation. We discussed these issues at length, my hope is  that with continued alcohol abstinence and time he may get better. Daughter states he never went to see physicians in the past and does not think he will ever follow through as outpatient. He seems depressed but denies feelings of depression, daughter says this is the way he is and he probably won't stay too much longer in the hospital. I counseled them if they decline TIPS that is fine, I would recommend he stay in the hospital at least to complete his octreotide infusion and monitor for recurrent bleeding. He and daughter will think about it. He agreed to stay in the hospital tonight, states he will "likely go home tomorrow AMA". He understands the risks of leaving without completing his inpatient therapy include rebleeding and death. Our team will reassess him tomorrow.

## 2019-08-24 NOTE — Progress Notes (Signed)
[facility] Progress Note  Patient: Bobby Dixon [id number]   PCP: System, Pcp Not In DOB: [birth year]   DOA: 08/23/2019   DOS: 08/24/2019   Date of Service: the patient was seen and examined on 08/24/2019  Chief Complaint  Patient presents with  . Rectal Bleeding   Brief hospital course: Pt. with PMH of alcohol abuse, no drinking since last 1, alcoholic cirrhosis, esophageal varices, ascites; presented with complain of hematemesis and melena, was found to have variceal bleeding.  Patient underwent emergent endoscopy in the ER on 08/23/2019.  Large esophageal varices with red well sign banded x6.  Currently further plan is continue IV Protonix and IV octreotide and arrange for TIPS.  Subjective: Patient denies any abdominal pain nausea or vomiting.  Breathing okay.  No fever no chills.  Assessment and Plan: 1.  Upper GI variceal bleeding. Alcoholic cirrhosis. Ascites. Acute blood loss anemia. History of alcohol abuse, sober since last 2 months. S/P upper GI endoscopy and variceal bleeding x6. Appreciate [facility] consultation. Felt that patient is a candidate for early TIPS to prevent recurrent variceal bleeding. Patient is currently refusing any procedure or intervention. Agreeable to stay in the hospital to continue IV Protonix and IV octreotide. Please see GI note for extensive discussion as well as assessment and plan. Hemoglobin relatively stable.  2.  Active smoker. Continue nicotine patch.  Counseled to quit smoking.  Currently not candidate.  3.  Acute blood loss anemia. Macrocytosis Leukocytosis secondary to alcohol abuse and nutritional deficiency. Hemoglobin drop from baseline 13-8.0 but remained stable for now. Monitor in the stepdown unit. Continue Protonix and octreotide infusion.  4.  Recurrent ascites. S/P paracentesis 3.8 L removed. Albumin infusion ordered. IV ceftriaxone ordered for concern for SBP prophylaxis.  5. Hypotension  Blood pressure  relatively soft. Likely secondary to being on octreotide as well as secondary to alcoholic cirrhosis. Currently IV albumin. Monitor.  6.  Acute hypoxia Initially was on 3 L of oxygen. Currently on room air. Monitor.  Diet: clea liquid diet DVT Prophylaxis: SCD, pharmacological prophylaxis contraindicated due to Acute bleeding  Advance goals of care discussion: DNR/DNI  Family Communication: no family was present at bedside, at the time of interview.   Disposition:  Discharge to be determined .  Consultants: gastroenterology  Procedures: Upper GI endoscopy  Scheduled Meds: . Chlorhexidine Gluconate Cloth  6 each Topical Daily  . Chlorhexidine Gluconate Cloth  6 each Topical Q0600  . folic acid  1 mg Oral Daily  . mouth rinse  15 mL Mouth Rinse BID  . mupirocin ointment  1 application Nasal BID  . [START ON 08/27/2019] pantoprazole  40 mg Intravenous Q12H  . sodium chloride (PF)      . thiamine  100 mg Oral Daily   Continuous Infusions: . sodium chloride 50 mL/hr at 08/24/19 1600  . albumin human 60 mL/hr at 08/24/19 1600  . cefTRIAXone (ROCEPHIN)  IV Stopped (08/24/19 0424)  . octreotide  (SANDOSTATIN)    IV infusion 50 mcg/hr (08/24/19 1600)  . pantoprozole (PROTONIX) infusion Stopped (08/24/19 1500)   PRN Meds:  Antibiotics: Anti-infectives (From admission, onward)   Start     Dose/Rate Route Frequency Ordered Stop   08/24/19 0400  cefTRIAXone (ROCEPHIN) 2 g in sodium chloride 0.9 % 100 mL IVPB     2 g 200 mL/hr over 30 Minutes Intravenous Every 24 hours 08/23/19 1754     08/23/19 1615  cefTRIAXone (ROCEPHIN) 1 g in sodium chloride 0.9 % 100  mL IVPB  Status:  Discontinued     1 g 200 mL/hr over 30 Minutes Intravenous Every 24 hours 08/23/19 1601 08/23/19 1754   08/23/19 1315  cefTRIAXone (ROCEPHIN) 1 g in sodium chloride 0.9 % 100 mL IVPB     1 g 200 mL/hr over 30 Minutes Intravenous  Once 08/23/19 1301 08/23/19 1340       Objective: Physical Exam: Vitals:    08/24/19 1400 08/24/19 1500 08/24/19 1557 08/24/19 1600  BP: 94/67 95/66  (!) 83/58  Pulse: 62 62  67  Resp: (!) 22 17  18   Temp:   98 F (36.7 C)   TempSrc:   Oral   SpO2: 100% 96%  94%  Weight:      Height:        Intake/Output Summary (Last 24 hours) at 08/24/2019 1619 Last data filed at 08/24/2019 1600 Gross per 24 hour  Intake 3125.94 ml  Output 750 ml  Net 2375.94 ml   Filed Weights   08/23/19 1214 08/23/19 1614 08/23/19 1749  Weight: 73.3 kg 73.3 kg 72.8 kg   General: alert and oriented to time, place, and person. Appear in moderate distress, affect flat Eyes: PERRL, Conjunctiva normal ENT: Oral Mucosa Clear, moist  Neck: no JVD, no Abnormal Mass Or lumps Cardiovascular: S1 and S2 Present, no Murmur, peripheral pulses symmetrical Respiratory: good respiratory effort, Bilateral Air entry equal and Decreased, no signs of accessory muscle use, Clear to Auscultation, no Crackles, no wheezes Abdomen: Bowel Sound present, Soft and mild tenderness, no hernia Skin: no rashes  Extremities: no Pedal edema, no calf tenderness Neurologic: without any new focal findings Gait not checked due to patient safety concerns  Data Reviewed: I have personally reviewed and interpreted daily labs, tele strips, imagings as discussed above. I reviewed all nursing notes, pharmacy notes, vitals, pertinent old records I have discussed plan of care as described above with RN and patient/family.  CBC: Recent Labs  Lab 08/23/19 1302 08/23/19 1907 08/24/19 0009 08/24/19 0641 08/24/19 1318  WBC 14.7*  --  9.2 12.1*  --   HGB 10.4* 8.8* 8.1* 8.0* 8.3*  HCT 32.2* 27.9* 25.2* 25.2* 26.2*  MCV 108.4*  --  109.6* 110.0*  --   PLT 218  --  154 149*  --    Basic Metabolic Panel: Recent Labs  Lab 08/23/19 1302 08/24/19 0009 08/24/19 0641  NA 138 140 139  K 4.6 5.2* 4.8  CL 104 109 109  CO2 21* 20* 21*  GLUCOSE 122* 151* 127*  BUN 34* 36* 39*  CREATININE 0.85 0.91 0.85  CALCIUM 9.0 8.2*  7.9*    Liver Function Tests: Recent Labs  Lab 08/23/19 1302 08/24/19 0009  AST 143* 126*  ALT 117* 106*  ALKPHOS 500* 390*  BILITOT 1.8* 1.6*  PROT 7.4 6.1*  ALBUMIN 2.7* 2.3*   No results for input(s): LIPASE, AMYLASE in the last 168 hours. No results for input(s): AMMONIA in the last 168 hours. Coagulation Profile: Recent Labs  Lab 08/23/19 1303 08/24/19 0009  INR 1.0 1.1   Cardiac Enzymes: No results for input(s): CKTOTAL, CKMB, CKMBINDEX, TROPONINI in the last 168 hours. BNP (last 3 results) No results for input(s): PROBNP in the last 8760 hours. CBG: Recent Labs  Lab 08/24/19 0800  GLUCAP 120*   Studies: US Paracentesis  Result Date: 08/24/2019 INDICATION: Ascites secondary to alcoholic cirrhosis. Request for diagnostic and therapeutic paracentesis. EXAM: ULTRASOUND GUIDED PARACENTESIS MEDICATIONS: 1% lidocaine 10 mL COMPLICATIONS: None immediate. PROCEDURE:  Informed written consent was obtained from the patient after a discussion of the risks, benefits and alternatives to treatment. A timeout was performed prior to the initiation of the procedure. Initial ultrasound scanning demonstrates a moderate amount of ascites within the right lower abdominal quadrant. The right lower abdomen was prepped and draped in the usual sterile fashion. 1% lidocaine with epinephrine was used for local anesthesia. Following this, a 19 gauge, 7-cm, Yueh catheter was introduced. An ultrasound image was saved for documentation purposes. The paracentesis was performed. The catheter was removed and a dressing was applied. The patient tolerated the procedure well without immediate post procedural complication. FINDINGS: A total of approximately 3.8 of clear yellow fluid was removed. Samples were sent to the laboratory as requested by the clinical team. IMPRESSION: Successful ultrasound-guided paracentesis yielding 3.8 liters of peritoneal fluid. Read by: Gareth Eagle, PA-C Electronically Signed   By:  Jerilynn Mages.  Shick M.D.   On: 08/24/2019 13:12    Time spent: 35 minutes  Author: Berle Mull, MD [facility] 08/24/2019 4:19 PM  To reach On-call, see care teams to locate the attending and reach out to them via [email or web address]. If 7PM-7AM, please contact night-coverage If you still have difficulty reaching the attending provider, please page the [facility] (Director on Call) for [facility] on amion for assistance.

## 2019-08-25 ENCOUNTER — Encounter (HOSPITAL_COMMUNITY): Payer: Self-pay | Admitting: Gastroenterology

## 2019-08-25 ENCOUNTER — Inpatient Hospital Stay (HOSPITAL_COMMUNITY): Payer: Medicaid Other

## 2019-08-25 DIAGNOSIS — I9589 Other hypotension: Secondary | ICD-10-CM

## 2019-08-25 DIAGNOSIS — I5189 Other ill-defined heart diseases: Secondary | ICD-10-CM

## 2019-08-25 DIAGNOSIS — R188 Other ascites: Secondary | ICD-10-CM

## 2019-08-25 DIAGNOSIS — I8511 Secondary esophageal varices with bleeding: Principal | ICD-10-CM

## 2019-08-25 DIAGNOSIS — K7460 Unspecified cirrhosis of liver: Secondary | ICD-10-CM

## 2019-08-25 LAB — BASIC METABOLIC PANEL
Anion gap: 7 (ref 5–15)
BUN: 34 mg/dL — ABNORMAL HIGH (ref 6–20)
CO2: 21 mmol/L — ABNORMAL LOW (ref 22–32)
Calcium: 8.1 mg/dL — ABNORMAL LOW (ref 8.9–10.3)
Chloride: 111 mmol/L (ref 98–111)
Creatinine, Ser: 0.79 mg/dL (ref 0.61–1.24)
GFR calc Af Amer: >60 mL/min (ref >60)
GFR calc non Af Amer: >60 mL/min (ref >60)
Glucose, Bld: 105 mg/dL — ABNORMAL HIGH (ref 70–99)
Potassium: 4.4 mmol/L (ref 3.5–5.1)
Sodium: 139 mmol/L (ref 135–145)

## 2019-08-25 LAB — CBC
HCT: 22.9 % — ABNORMAL LOW (ref 39.0–52.0)
HCT: 24 % — ABNORMAL LOW (ref 39.0–52.0)
HCT: 28 % — ABNORMAL LOW (ref 39.0–52.0)
Hemoglobin: 7.1 g/dL — ABNORMAL LOW (ref 13.0–17.0)
Hemoglobin: 7.5 g/dL — ABNORMAL LOW (ref 13.0–17.0)
Hemoglobin: 8.3 g/dL — ABNORMAL LOW (ref 13.0–17.0)
MCH: 35.3 pg — ABNORMAL HIGH (ref 26.0–34.0)
MCH: 35.7 pg — ABNORMAL HIGH (ref 26.0–34.0)
MCH: 35 pg — ABNORMAL HIGH (ref 26.0–34.0)
MCHC: 31 g/dL (ref 30.0–36.0)
MCHC: 31.3 g/dL (ref 30.0–36.0)
MCHC: 29.6 g/dL — ABNORMAL LOW (ref 30.0–36.0)
MCV: 113.9 fL — ABNORMAL HIGH (ref 80.0–100.0)
MCV: 114.3 fL — ABNORMAL HIGH (ref 80.0–100.0)
MCV: 118.1 fL — ABNORMAL HIGH (ref 80.0–100.0)
Platelets: 126 10*3/uL — ABNORMAL LOW (ref 150–400)
Platelets: 140 10*3/uL — ABNORMAL LOW (ref 150–400)
Platelets: 118 10*3/uL — ABNORMAL LOW (ref 150–400)
RBC: 2.01 MIL/uL — ABNORMAL LOW (ref 4.22–5.81)
RBC: 2.1 MIL/uL — ABNORMAL LOW (ref 4.22–5.81)
RBC: 2.37 MIL/uL — ABNORMAL LOW (ref 4.22–5.81)
RDW: 17 % — ABNORMAL HIGH (ref 11.5–15.5)
RDW: 17 % — ABNORMAL HIGH (ref 11.5–15.5)
RDW: 19.5 % — ABNORMAL HIGH (ref 11.5–15.5)
WBC: 12 10*3/uL — ABNORMAL HIGH (ref 4.0–10.5)
WBC: 10.9 10*3/uL — ABNORMAL HIGH (ref 4.0–10.5)
WBC: 11.6 10*3/uL — ABNORMAL HIGH (ref 4.0–10.5)
nRBC: 0.3 % — ABNORMAL HIGH (ref 0.0–0.2)
nRBC: 0.4 % — ABNORMAL HIGH (ref 0.0–0.2)
nRBC: 0.2 % (ref 0.0–0.2)

## 2019-08-25 LAB — ACID FAST SMEAR (AFB, MYCOBACTERIA): Acid Fast Smear: NEGATIVE

## 2019-08-25 LAB — PATHOLOGIST SMEAR REVIEW

## 2019-08-25 LAB — MAGNESIUM: Magnesium: 2.3 mg/dL (ref 1.7–2.4)

## 2019-08-25 LAB — ECHOCARDIOGRAM COMPLETE
Height: 72 in
Weight: 2567.92 oz

## 2019-08-25 LAB — PREPARE RBC (CROSSMATCH)

## 2019-08-25 MED ORDER — SODIUM CHLORIDE 0.9% IV SOLUTION: Freq: Once | INTRAVENOUS | Status: DC

## 2019-08-25 NOTE — Progress Notes (Addendum)
Patient ID: Bobby Dixon, male   DOB: 1961/05/17, 58 y.o.   MRN: [id number]    Progress Note   Subjective  Day # 2 CC;decompensated cirrhosis, melena , variceal bleed  Pt alert , denies any pain no vomiting or melena, tolerating liquids Octreotide/ Protonix  EGD 9/12- large varices - banded x 6 , no gastric varices   hgb 7.1  plts 126 INR 1.1 Peritoneal fluid cell counts - not consistent with SBP  MELD = 11  Objective   Vital signs in last 24 hours: Temp:  [98 F (36.7 C)-98.5 F (36.9 C)] 98.3 F (36.8 C) (09/14 0800) Pulse Rate:  [62-76] 64 (09/14 0500) Resp:  [17-22] 18 (09/14 0500) BP: (76-116)/(48-82) 84/60 (09/14 0500) SpO2:  [89 %-100 %] 98 % (09/14 0500) Last BM Date: 08/24/19 General:  older  white male in NAD Heart:  Regular rate and rhythm; no murmurs Lungs: Respirations even and unlabored, lungs CTA bilaterally Abdomen:  Soft,nontense ascites, nontender and nondistended. Normal bowel sounds. Extremities:  Without edema. Neurologic:  Alert and oriented,  grossly normal neurologically.No asterixis Psych:  Cooperative. Normal mood and affect.  Intake/Output from previous day: 09/13 0701 - 09/14 0700 In: 2303.2 [P.O.:240; I.V.:1771.3; IV Piggyback:291.9] Out: 925 [Urine:925] Intake/Output this shift: No intake/output data recorded.  Lab Results: Recent Labs    08/24/19 0009 08/24/19 0641 08/24/19 1318 08/25/19 0328  WBC 9.2 12.1*  --  12.0*  HGB 8.1* 8.0* 8.3* 7.1*  HCT 25.2* 25.2* 26.2* 22.9*  PLT 154 149*  --  126*   BMET Recent Labs    08/24/19 0009 08/24/19 0641 08/25/19 0328  NA 140 139 139  K 5.2* 4.8 4.4  CL 109 109 111  CO2 20* 21* 21*  GLUCOSE 151* 127* 105*  BUN 36* 39* 34*  CREATININE 0.91 0.85 0.79  CALCIUM 8.2* 7.9* 8.1*   LFT Recent Labs    08/24/19 0009  PROT 6.1*  ALBUMIN 2.3*  AST 126*  ALT 106*  ALKPHOS 390*  BILITOT 1.6*   PT/INR Recent Labs    08/23/19 1303 08/24/19 0009  LABPROT 13.0 13.7  INR 1.0  1.1    Studies/Results: US Paracentesis  Result Date: 08/24/2019 INDICATION: Ascites secondary to alcoholic cirrhosis. Request for diagnostic and therapeutic paracentesis. EXAM: ULTRASOUND GUIDED PARACENTESIS MEDICATIONS: 1% lidocaine 10 mL COMPLICATIONS: None immediate. PROCEDURE: Informed written consent was obtained from the patient after a discussion of the risks, benefits and alternatives to treatment. A timeout was performed prior to the initiation of the procedure. Initial ultrasound scanning demonstrates a moderate amount of ascites within the right lower abdominal quadrant. The right lower abdomen was prepped and draped in the usual sterile fashion. 1% lidocaine with epinephrine was used for local anesthesia. Following this, a 19 gauge, 7-cm, Yueh catheter was introduced. An ultrasound image was saved for documentation purposes. The paracentesis was performed. The catheter was removed and a dressing was applied. The patient tolerated the procedure well without immediate post procedural complication. FINDINGS: A total of approximately 3.8 of clear yellow fluid was removed. Samples were sent to the laboratory as requested by the clinical team. IMPRESSION: Successful ultrasound-guided paracentesis yielding 3.8 liters of peritoneal fluid. Read by: Gareth Eagle, PA-C Electronically Signed   By: Jerilynn Mages.  Shick M.D.   On: 08/24/2019 13:12       Assessment / Plan:    #40  58 year old white male, recently diagnosed with decompensated cirrhosis secondary to EtOH. He has been abstinent of the past month and a  half. Readmitted this weekend with acute GI bleed with melena.  EGD yesterday revealed large esophageal varices, he was banded x6, no evidence of gastric varices.   Abd [place] 8/ 2020 - Cirrhosis, no hepatic mass  He has been stable since with no evidence of active hemorrhage. He continues on octreotide and IV Protonix Hemoglobin 7.1 this morning-transfuse for hemoglobin less than 7 Continue  ceftriaxone Dr. Havery Moros had recommended IR consultation for TIPS, given large varices and increased risk for recurrent hemorrhage.  Patient has expressed to the hospitalist this morning that he does not want any overly aggressive treatment to prolong his life. Hospitalist asking for guidance regarding patient's prognosis.  Patient is worried about cost as he does not have insurance, and wants to leave the hospital soon as possible. He is also interested in talking with palliative care.  Current MELD is 11, with associated 20-month mortality of about 6%. Patient declining TIPS currently, but after conversation this morning says he would be agreeable to serial banding of esophageal varices to prevent recurrent hemorrhage. He has been abstinent for the past 2  months, and if can maintain abstinence, one year survival  About 40 %  Advance to full liquids AFP pending  #2 coagulopathy-continue vitamin K #3 ascites-no evidence of SBP on cell counts from paracentesis yesterday     Active Problems:   GI bleed     LOS: 2 days   Meziah Blasingame PA-C 08/25/2019, 9:10 AM

## 2019-08-25 NOTE — Consult Note (Addendum)
Cardiology Consultation:  Patient ID: KURK SNAY MRN: [id number]; DOB: July 07, 1961  Admit date: 08/23/2019 Date of Consult: 08/25/2019  Primary Care Provider: System, Pcp Not In Primary Cardiologist: No primary care provider on file. Primary Electrophysiologist:  None   Patient Profile:  Bobby Dixon is a 58 y.o. male with a hx of cirrhosis, esophageal varices who is being seen today for the evaluation of right atrial mass at the request of Berle Mull, MD.  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  He has undergone esophageal variceal banding by GI.  An echocardiogram was obtained this morning as part of work-up for his new diagnosis, and a mass was noted in the right atrium.  It appears his diagnosis of cirrhosis and GI bleeding is new.  He reports he was a heavy drinker, nearly 6 beers per day until about 6 weeks ago.  He continues to smoke half pack per day.  He reports no history of cancer or cardiac history.  He states he was relatively healthy until a number of weeks ago.  He is undergone variceal banding, and remains in the medical ICU on an octreotide drip, and is receiving 1 unit of packed red blood cells.  He reports no prior cardiovascular history.  He states that he has no real interest in pursuing any other work-up for this.  He has no cardiac symptoms, and denies chest pain, trouble breathing, lower extreme edema, any other symptoms.  Heart Pathway Score:       Past Medical History: Past Medical History:  Diagnosis Date  . Cirrhosis [facility])     Past Surgical History: Past Surgical History:  Procedure Laterality Date  . ESOPHAGEAL BANDING  08/23/2019   Procedure: ESOPHAGEAL BANDING;  Surgeon: Yetta Flock, MD;  Location: [facility]OSCOPY;  Service: Gastroenterology;;  .  ESOPHAGOGASTRODUODENOSCOPY (EGD) WITH PROPOFOL N/A 08/23/2019   Procedure: ESOPHAGOGASTRODUODENOSCOPY (EGD) WITH PROPOFOL;  Surgeon: Yetta Flock, MD;  Location: [facility];  Service: Gastroenterology;  Laterality: N/A;     Home Medications:  Prior to Admission medications   Medication Sig Start Date End Date Taking? Authorizing Provider  acetaminophen (TYLENOL) 325 MG tablet Take 650 mg by mouth every 6 (six) hours as needed for mild pain, moderate pain or headache.   Yes [provider]  folic acid (FOLVITE) 1 MG tablet Take 1 mg by mouth daily. 07/27/19  Yes [provider]  ondansetron (ZOFRAN) 8 MG tablet Take 8 mg by mouth every 8 (eight) hours as needed for nausea/vomiting. 08/06/19  Yes [provider]  pantoprazole (PROTONIX) 40 MG tablet Take 40 mg by mouth daily. 08/06/19  Yes [provider]  Thiamine HCl (VITAMIN B1) 100 MG TABS Take 1 tablet by mouth daily. 07/27/19  Yes [provider]    Inpatient Medications: Scheduled Meds: . sodium chloride   Intravenous Once  . Chlorhexidine Gluconate Cloth  6 each Topical Daily  . Chlorhexidine Gluconate Cloth  6 each Topical Q0600  . folic acid  1 mg Oral Daily  . mouth rinse  15 mL Mouth Rinse BID  . mupirocin ointment  1 application Nasal BID  . [START ON 08/27/2019] pantoprazole  40 mg Intravenous Q12H  . thiamine  100 mg Oral Daily   Continuous Infusions: . sodium chloride 50 mL/hr at 08/25/19 1100  . cefTRIAXone (  ROCEPHIN)  IV Stopped (08/25/19 0604)  . octreotide  (SANDOSTATIN)    IV infusion 50 mcg/hr (08/25/19 1100)  . pantoprozole (PROTONIX) infusion 8 mg/hr (08/25/19 1100)   PRN Meds:   Allergies:    No Known Allergies  Social History:   Social History   Socioeconomic History  . Marital status: Divorced    Spouse name: Not on file  . Number of children: Not on file  . Years of education: Not on file  . Highest education level: Not on file  Occupational  History  . Not on file  Social Needs  . Financial resource strain: Not on file  . Food insecurity    Worry: Not on file    Inability: Not on file  . Transportation needs    Medical: Not on file    Non-medical: Not on file  Tobacco Use  . Smoking status: Current Every Day Smoker  . Smokeless tobacco: Never Used  Substance and Sexual Activity  . Alcohol use: Not Currently    Comment: quit two weeks ago  . Drug use: Not on file  . Sexual activity: Not on file  Lifestyle  . Physical activity    Days per week: Not on file    Minutes per session: Not on file  . Stress: Not on file  Relationships  . Social Herbalist on phone: Not on file    Gets together: Not on file    Attends religious service: Not on file    Active member of club or organization: Not on file    Attends meetings of clubs or organizations: Not on file    Relationship status: Not on file  . Intimate partner violence    Fear of current or ex partner: Not on file    Emotionally abused: Not on file    Physically abused: Not on file    Forced sexual activity: Not on file  Other Topics Concern  . Not on file  Social History Narrative  . Not on file     Family History:   Family History  Problem Relation Age of Onset  . Diabetes Mellitus II Neg Hx      ROS:  All other ROS reviewed and negative. Pertinent positives noted in the HPI.     Physical Exam/Data:   Vitals:   08/25/19 1040 08/25/19 1100 08/25/19 1209 08/25/19 1300  BP: 94/72 95/63 93/68    Pulse: 73 63 (!) 59 (!) 59  Resp: (!) 23 16 14 18   Temp: 97.6 F (36.4 C) 98.3 F (36.8 C)    TempSrc: Oral Oral    SpO2: 94% 97% 98% 99%  Weight:      Height:         Intake/Output Summary (Last 24 hours) at 08/25/2019 1348 Last data filed at 08/25/2019 1230 Gross per 24 hour  Intake 2861 ml  Output 575 ml  Net 2286 ml    Last 3 Weights 08/23/2019 08/23/2019 08/23/2019  Weight (lbs) 160 lb 7.9 oz 161 lb 9.6 oz 161 lb 9.6 oz  Weight (kg)  72.8 kg 73.301 kg 73.301 kg    Body mass index is 21.77 kg/m.  General: Well nourished, well developed, in no acute distress Heart: Atraumatic, normal size  Eyes: PEERLA, EOMI  Neck: Supple, no JVD Endocrine: No thryomegaly Cardiac: Normal S1, S2; RRR; no murmurs, rubs, or gallops Lungs: Clear to auscultation bilaterally, no wheezing, rhonchi or rales  Abd: Soft, nontender, no hepatomegaly  Ext: No  edema, pulses 2+ Musculoskeletal: No deformities, BUE and BLE strength normal and equal Skin: Warm and dry, no rashes   Neuro: Alert and oriented to person, place, time, and situation, CNII-XII grossly intact, no focal deficits  Psych: Normal mood and affect   EKG:  The EKG was personally reviewed and demonstrates: Normal sinus rhythm, heart rate 92, no acute ST-T changes, no prior infarction Telemetry:  Telemetry was personally reviewed and demonstrates: Normal sinus rhythm, heart rate 80s  Laboratory Data: High Sensitivity Troponin:  No results for input(s): TROPONINIHS in the last 720 hours.   Cardiac EnzymesNo results for input(s): TROPONINI in the last 168 hours. No results for input(s): TROPIPOC in the last 168 hours.  Chemistry Recent Labs  Lab 08/24/19 0009 08/24/19 0641 08/25/19 0328  NA 140 139 139  K 5.2* 4.8 4.4  CL 109 109 111  CO2 20* 21* 21*  GLUCOSE 151* 127* 105*  BUN 36* 39* 34*  CREATININE 0.91 0.85 0.79  CALCIUM 8.2* 7.9* 8.1*  GFRNONAA >60 >60 >60  GFRAA >60 >60 >60  ANIONGAP 11 9 7     Recent Labs  Lab 08/23/19 1302 08/24/19 0009  PROT 7.4 6.1*  ALBUMIN 2.7* 2.3*  AST 143* 126*  ALT 117* 106*  ALKPHOS 500* 390*  BILITOT 1.8* 1.6*   Hematology Recent Labs  Lab 08/24/19 0641 08/24/19 1318 08/25/19 0328 08/25/19 0837  WBC 12.1*  --  12.0* 10.9*  RBC 2.29*  --  2.01* 2.10*  HGB 8.0* 8.3* 7.1* 7.5*  HCT 25.2* 26.2* 22.9* 24.0*  MCV 110.0*  --  113.9* 114.3*  MCH 34.9*  --  35.3* 35.7*  MCHC 31.7  --  31.0 31.3  RDW 16.8*  --  17.0* 17.0*   PLT 149*  --  126* 140*   BNPNo results for input(s): BNP, PROBNP in the last 168 hours.  DDimer No results for input(s): DDIMER in the last 168 hours.  Radiology/Studies:  US Paracentesis  Result Date: 08/24/2019 INDICATION: Ascites secondary to alcoholic cirrhosis. Request for diagnostic and therapeutic paracentesis. EXAM: ULTRASOUND GUIDED PARACENTESIS MEDICATIONS: 1% lidocaine 10 mL COMPLICATIONS: None immediate. PROCEDURE: Informed written consent was obtained from the patient after a discussion of the risks, benefits and alternatives to treatment. A timeout was performed prior to the initiation of the procedure. Initial ultrasound scanning demonstrates a moderate amount of ascites within the right lower abdominal quadrant. The right lower abdomen was prepped and draped in the usual sterile fashion. 1% lidocaine with epinephrine was used for local anesthesia. Following this, a 19 gauge, 7-cm, Yueh catheter was introduced. An ultrasound image was saved for documentation purposes. The paracentesis was performed. The catheter was removed and a dressing was applied. The patient tolerated the procedure well without immediate post procedural complication. FINDINGS: A total of approximately 3.8 of clear yellow fluid was removed. Samples were sent to the laboratory as requested by the clinical team. IMPRESSION: Successful ultrasound-guided paracentesis yielding 3.8 liters of peritoneal fluid. Read by: Gareth Eagle, PA-C Electronically Signed   By: Jerilynn Mages.  Shick M.D.   On: 08/24/2019 13:12    Assessment and Plan:  1. RA Mass -Seen on bedside echocardiogram this morning, and awaiting formal report.  The mass was impressive in the right atrium posterior wall, and appears to be an extension from the IVC.  This could represent a thrombus versus malignancy.  It is really difficult to know what this is just by ultrasound alone.  A cardiac MRI would be recommended for further evaluation,  but Mr. Costlow is really not  interested in this at this time.  I did discuss with him that this would be important for his work-up for further care of his cirrhosis, but he states he is not interested.  His daughter was in the room with him and reports he is simply just not interested in a lot of medical care at this time. -I will see him back tomorrow 1 more time to determine if he would like any other cardiac evaluation, at that time we can discuss next plans for when he leaves hospital -He is in really no shape right now to have evaluation, and we would likely pursue an MRI once he is out of ICU if he is amenable   For questions or updates, please contact [place] Please consult [email or web address] for contact info under     Signed, [place] B[place] T. Audie Box, [place]  08/25/2019 1:48 PM

## 2019-08-25 NOTE — Progress Notes (Signed)
  Echocardiogram 2D Echocardiogram has been performed.  Bobby Dixon 08/25/2019, 12:13 PM

## 2019-08-25 NOTE — Anesthesia Postprocedure Evaluation (Signed)
Anesthesia Post Note  Patient: Bobby Dixon  Procedure(s) Performed: ESOPHAGOGASTRODUODENOSCOPY (EGD) WITH PROPOFOL (N/A ) ESOPHAGEAL BANDING     Patient location during evaluation: PACU Anesthesia Type: General Level of consciousness: awake and alert Pain management: pain level controlled Vital Signs Assessment: post-procedure vital signs reviewed and stable Respiratory status: spontaneous breathing, nonlabored ventilation, respiratory function stable and patient connected to nasal cannula oxygen Cardiovascular status: blood pressure returned to baseline and stable Postop Assessment: no apparent nausea or vomiting Anesthetic complications: no    Last Vitals:  Vitals:   08/25/19 1009 08/25/19 1040  BP: 95/60 94/72  Pulse: 61 73  Resp: 18 (!) 23  Temp:    SpO2: 100% 94%    Last Pain:  Vitals:   08/25/19 0800  TempSrc: Oral  PainSc: 0-No pain                 Tiajuana Amass

## 2019-08-25 NOTE — Progress Notes (Signed)
[facility] Progress Note  Patient: Bobby Dixon [id number]   PCP: System, Pcp Not In DOB: [birth year]   DOA: 08/23/2019   DOS: 08/25/2019   Date of Service: the patient was seen and examined on 08/25/2019  Chief Complaint  Patient presents with   Rectal Bleeding   Brief hospital course: Pt. with PMH of alcohol abuse, no drinking since last 1, alcoholic cirrhosis, esophageal varices, ascites; presented with complain of hematemesis and melena, was found to have variceal bleeding.  Patient underwent emergent endoscopy in the ER on 08/23/2019.  Large esophageal varices with red well sign banded x6.  Currently further plan is to continue IV Protonix and IV octreotide.  Also consulted case management to assist with insurance.  Subjective: Patient denies any acute complaint.  Flat affect.  No nausea no vomiting no fever no chills.  No bowel movement so far.  Hungry.  Assessment and Plan: 1.  Upper GI variceal bleeding. Alcoholic cirrhosis. Ascites. Acute blood loss anemia. History of alcohol abuse, sober since last 2 months. S/P upper GI endoscopy and variceal bleeding x6. Appreciate [facility] consultation. Felt that patient is a candidate for early TIPS to prevent recurrent variceal bleeding. Patient is currently refusing any procedure or intervention. Agreeable to stay in the hospital to continue IV Protonix and IV octreotide. Please see GI note for extensive discussion as well as assessment and plan. Hemoglobin dropped from 8-7. GI thinks that the patient is not actively bleeding. We will transfuse 1 PRBC. Continue with Protonix and octreotide. Patient has refused TIPS procedure. Discussed with patient regarding goals of care and importance of follow-up as well as prognosis. Appreciate GI recommendation regarding prognosis as it helped making the shared decision making plan for the patient.  2.  Active smoker. Continue nicotine patch.  Counseled to quit smoking.  Currently  not candidate.  3.  Acute blood loss anemia. Macrocytosis Leukocytosis secondary to alcohol abuse and nutritional deficiency. Hemoglobin drop from baseline 13-8.0 but remained stable for now. Hemoglobin dropped further down on 08/24/2021 7.0. Transfuse 1 PRBC in the setting of hypotension. Continue Protonix and octreotide infusion.  4.  Recurrent ascites. S/P paracentesis 3.8 L removed. Albumin infusion provided. IV ceftriaxone ordered for concern for SBP prophylaxis.  5. Hypotension  Blood pressure relatively soft. Likely secondary to being on octreotide as well as secondary to alcoholic cirrhosis. Given IV albumin. Also given IV blood transfusion. Once hepatitis completed blood pressure should improve.  6.  Acute hypoxia Initially was on 3 L of oxygen. Currently on room air. Monitor.  7.  Goals of care discussion. I had a prolonged discussion with patient as well as patient's daughter on 08/25/2019. This discussion were held after meeting with gastroenterology. Patient mentions that he does not want to stay in the hospital and wants to leave [place] as he is afraid that he will continue to file and gather medical bills. He also does not want to be in and out of the hospital at end of his life. Discussed with him regarding prognosis information provided by GI diet with his current meld score if the patient continues to follow medical treatment his 71-month survival is 6% and with TIPS and other medical intervention his chances of 1 year survival is 60%. I stressed significantly about the importance of follow-up and medical treatment. I offered him the option of considering hospice should he decide that he does not want any medical treatment or intervention. While he therapy is not ready to make  any decision I recommended to continue to engage with him regarding goals of care whenever he follows up. Daughter was at bedside who stressed that patient currently is not  ready to consider TIPS. Daughter will make sure that the patient follows up with GI for repeat endoscopy in a potential banding. Currently patient would like to go home tomorrow. Patient does not have a PCP and will need a PCP at the time of the discharge. Currently does not want to consider palliative care or hospice.  8.  Right atrial mass. Echocardiogram was ordered as a part of work-up for cryptogenic cirrhosis. 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 Potentially represent either tumor or thrombus. Currently anticoagulation is contraindicated given patient's presentation with GI bleed and varices. Patient does not want any further work-up including cardiac MR or any other work-up to rule out malignancy at present. Appreciate cardiology input.  Personally discussed with cardiology  Diet: Full liquid diet DVT Prophylaxis: SCD, pharmacological prophylaxis contraindicated due to Acute bleeding  Advance goals of care discussion: DNR/DNI  Family Communication: no family was present at bedside, at the time of interview.   Disposition:  Discharge to be determined .  Consultants: gastroenterology  Procedures: Upper GI endoscopy  Scheduled Meds:  sodium chloride   Intravenous Once   Chlorhexidine Gluconate Cloth  6 each Topical Daily   Chlorhexidine Gluconate Cloth  6 each Topical 99991111   folic acid  1 mg Oral Daily   mouth rinse  15 mL Mouth Rinse BID   mupirocin ointment  1 application Nasal BID   [START ON 08/27/2019] pantoprazole  40 mg Intravenous Q12H   thiamine  100 mg Oral Daily   Continuous Infusions:  sodium chloride 50 mL/hr at 08/25/19 1600   cefTRIAXone (ROCEPHIN)  IV Stopped (08/25/19 0604)   octreotide  (SANDOSTATIN)    IV infusion 50 mcg/hr (08/25/19 1600)   pantoprozole (PROTONIX) infusion 8 mg/hr (08/25/19 1600)   PRN Meds:  Antibiotics: Anti-infectives (From admission, onward)   Start     Dose/Rate Route Frequency  Ordered Stop   08/24/19 0400  cefTRIAXone (ROCEPHIN) 2 g in sodium chloride 0.9 % 100 mL IVPB     2 g 200 mL/hr over 30 Minutes Intravenous Every 24 hours 08/23/19 1754     08/23/19 1615  cefTRIAXone (ROCEPHIN) 1 g in sodium chloride 0.9 % 100 mL IVPB  Status:  Discontinued     1 g 200 mL/hr over 30 Minutes Intravenous Every 24 hours 08/23/19 1601 08/23/19 1754   08/23/19 1315  cefTRIAXone (ROCEPHIN) 1 g in sodium chloride 0.9 % 100 mL IVPB     1 g 200 mL/hr over 30 Minutes Intravenous  Once 08/23/19 1301 08/23/19 1340       Objective: Physical Exam: Vitals:   08/25/19 1400 08/25/19 1500 08/25/19 1600 08/25/19 1623  BP: 106/71   93/67  Pulse: 61 60 64 (!) 28  Resp: 19 19 20 15   Temp:      TempSrc:      SpO2: 99% 100% 98% 97%  Weight:      Height:        Intake/Output Summary (Last 24 hours) at 08/25/2019 1723 Last data filed at 08/25/2019 1600 Gross per 24 hour  Intake 2913.75 ml  Output 225 ml  Net 2688.75 ml   Filed Weights   08/23/19 1214 08/23/19 1614 08/23/19 1749  Weight: 73.3 kg 73.3 kg 72.8 kg   General: alert and  oriented to time, place, and person. Appear in moderate distress, affect flat in affect Eyes: PERRL, Conjunctiva normal ENT: Oral Mucosa Clear, moist  Neck: no JVD, no Abnormal Mass Or lumps Cardiovascular: S1 and S2 Present, no Murmur, peripheral pulses symmetrical Respiratory: good respiratory effort, Bilateral Air entry equal and Decreased, no signs of accessory muscle use, faint basal Crackles, no wheezes Abdomen: Bowel Sound resent, Soft and mild tenderness, no hernia Skin: no rashes  Extremities: no Pedal edema, no calf tenderness Neurologic: without any new focal findings Gait not checked due to patient safety concerns   Data Reviewed: I have personally reviewed and interpreted daily labs, tele strips, imagings as discussed above. I reviewed all nursing notes, pharmacy notes, vitals, pertinent old records I have discussed plan of care as  described above with RN and patient/family.  CBC: Recent Labs  Lab 08/24/19 0009 08/24/19 0641 08/24/19 1318 08/25/19 0328 08/25/19 0837 08/25/19 1423  WBC 9.2 12.1*  --  12.0* 10.9* 11.6*  HGB 8.1* 8.0* 8.3* 7.1* 7.5* 8.3*  HCT 25.2* 25.2* 26.2* 22.9* 24.0* 28.0*  MCV 109.6* 110.0*  --  113.9* 114.3* 118.1*  PLT 154 149*  --  126* 140* [id number]*   Basic Metabolic Panel: Recent Labs  Lab 08/23/19 1302 08/24/19 0009 08/24/19 0641 08/25/19 0328  NA 138 140 139 139  K 4.6 5.2* 4.8 4.4  CL 104 109 109 111  CO2 21* 20* 21* 21*  GLUCOSE 122* 151* 127* 105*  BUN 34* 36* 39* 34*  CREATININE 0.85 0.91 0.85 0.79  CALCIUM 9.0 8.2* 7.9* 8.1*  MG  --   --   --  2.3    Liver Function Tests: Recent Labs  Lab 08/23/19 1302 08/24/19 0009  AST 143* 126*  ALT 117* 106*  ALKPHOS 500* 390*  BILITOT 1.8* 1.6*  PROT 7.4 6.1*  ALBUMIN 2.7* 2.3*   No results for input(s): LIPASE, AMYLASE in the last 168 hours. No results for input(s): AMMONIA in the last 168 hours. Coagulation Profile: Recent Labs  Lab 08/23/19 1303 08/24/19 0009  INR 1.0 1.1   Cardiac Enzymes: No results for input(s): CKTOTAL, CKMB, CKMBINDEX, TROPONINI in the last 168 hours. BNP (last 3 results) No results for input(s): PROBNP in the last 8760 hours. CBG: Recent Labs  Lab 08/24/19 0800  GLUCAP 120*   Studies: No results found.  Time spent: Time in 8:15 AM, timeout 8:59 AM.  Time in 11:30 AM, time out 11:52 AM.  20 minutes were spent other than this to coordinate care with gastroenterology, cardiology, RN and case management.  Author: Berle Mull, MD [facility] 08/25/2019 5:23 PM  To reach On-call, see care teams to locate the attending and reach out to them via [email or web address]. If 7PM-7AM, please contact night-coverage If you still have difficulty reaching the attending provider, please page the [facility] (Director on Call) for [facility] on amion for assistance.

## 2019-08-26 DIAGNOSIS — K7460 Unspecified cirrhosis of liver: Secondary | ICD-10-CM

## 2019-08-26 DIAGNOSIS — I8511 Secondary esophageal varices with bleeding: Principal | ICD-10-CM

## 2019-08-26 DIAGNOSIS — R188 Other ascites: Secondary | ICD-10-CM

## 2019-08-26 LAB — CBC
HCT: 26.9 % — ABNORMAL LOW (ref 39.0–52.0)
Hemoglobin: 8.6 g/dL — ABNORMAL LOW (ref 13.0–17.0)
MCH: 34.7 pg — ABNORMAL HIGH (ref 26.0–34.0)
MCHC: 32 g/dL (ref 30.0–36.0)
MCV: 108.5 fL — ABNORMAL HIGH (ref 80.0–100.0)
Platelets: 121 10*3/uL — ABNORMAL LOW (ref 150–400)
RBC: 2.48 MIL/uL — ABNORMAL LOW (ref 4.22–5.81)
RDW: 19 % — ABNORMAL HIGH (ref 11.5–15.5)
WBC: 10.8 10*3/uL — ABNORMAL HIGH (ref 4.0–10.5)
nRBC: 0.2 % (ref 0.0–0.2)

## 2019-08-26 LAB — BASIC METABOLIC PANEL
Anion gap: 8 (ref 5–15)
BUN: 25 mg/dL — ABNORMAL HIGH (ref 6–20)
CO2: 20 mmol/L — ABNORMAL LOW (ref 22–32)
Calcium: 8 mg/dL — ABNORMAL LOW (ref 8.9–10.3)
Chloride: 110 mmol/L (ref 98–111)
Creatinine, Ser: 0.72 mg/dL (ref 0.61–1.24)
GFR calc Af Amer: >60 mL/min (ref >60)
GFR calc non Af Amer: >60 mL/min (ref >60)
Glucose, Bld: 97 mg/dL (ref 70–99)
Potassium: 4.3 mmol/L (ref 3.5–5.1)
Sodium: 138 mmol/L (ref 135–145)

## 2019-08-26 MED ORDER — PANTOPRAZOLE SODIUM 40 MG PO TBEC: 40.0000 mg | DELAYED_RELEASE_TABLET | Freq: Two times a day (BID) | ORAL | 0 refills | Status: AC

## 2019-08-26 MED ORDER — CEFDINIR 300 MG PO CAPS: 300.0000 mg | ORAL_CAPSULE | Freq: Two times a day (BID) | ORAL | 0 refills | Status: AC

## 2019-08-26 NOTE — Progress Notes (Signed)
Patient ID: Bobby Dixon, male   DOB: 16-Dec-1960, 58 y.o.   MRN: [id number]    Progress Note   Subjective  Day # 3 CC; acute GI bleed - variceal  Patient says he feels pretty good and is ready to go home.  He has been on a liquid diet and is hungry.  Bowel movements this morning brown. Patient denies any significant abdominal discomfort. 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 Extremities:  Without edema. Neurologic:  Alert and oriented,  grossly normal neurologically. Psych:  Cooperative. Normal mood and affect.  Intake/Output from previous day: 09/14 0701 - 09/15 0700 In: 3091.2 [P.O.:470; I.V.:2091.2; Blood:330; IV Piggyback:200] Out: 0  Intake/Output this shift: No intake/output data recorded.  Lab Results: Recent Labs    08/25/19 0837 08/25/19 1423 08/26/19 0634  WBC 10.9* 11.6* 10.8*  HGB 7.5* 8.3* 8.6*  HCT 24.0* 28.0* 26.9*  PLT 140* 118* 121*   BMET Recent Labs    08/24/19 0641 08/25/19 0328 08/26/19 0634  NA 139 139 138  K 4.8 4.4 4.3  CL 109 111 110  CO2 21* 21* 20*  GLUCOSE 127* 105* 97  BUN 39* 34* 25*  CREATININE 0.85 0.79 0.72  CALCIUM 7.9* 8.1* 8.0*   LFT Recent Labs    08/24/19 0009  PROT 6.1*  ALBUMIN 2.3*  AST 126*  ALT 106*  ALKPHOS 390*  BILITOT 1.6*   PT/INR Recent Labs    08/23/19 1303 08/24/19 0009  LABPROT 13.0 13.7  INR 1.0 1.1    Studies/Results: US  Paracentesis  Result Date: 08/24/2019 INDICATION: Ascites secondary to alcoholic cirrhosis. Request for diagnostic and therapeutic paracentesis. EXAM: ULTRASOUND GUIDED PARACENTESIS MEDICATIONS: 1% lidocaine 10 mL COMPLICATIONS: None immediate. PROCEDURE: Informed written consent was obtained from the patient after a discussion of the risks, benefits and alternatives to treatment. A timeout was performed prior to the initiation of the procedure. Initial ultrasound scanning demonstrates a moderate amount of ascites within the right lower abdominal quadrant. The right lower abdomen was prepped and draped in the usual sterile fashion. 1% lidocaine with epinephrine was used for local anesthesia. Following this, a 19 gauge, 7-cm, Yueh catheter was introduced. An ultrasound image was saved for documentation purposes. The paracentesis was performed. The catheter was removed and a dressing was applied. The patient tolerated the procedure well without immediate post procedural complication. FINDINGS: A total of approximately 3.8 of clear yellow fluid was removed. Samples were sent to the laboratory as requested by the clinical team. IMPRESSION: Successful ultrasound-guided paracentesis yielding 3.8 liters of peritoneal fluid. Read by: Bobby Eagle, PA-C Electronically Signed   By: Jerilynn Mages.  Shick M.D.   On: 08/24/2019 13:12       Assessment / Plan:    #32 58 year old white male with decompensated cirrhosis, history of EtOH abuse and active over the past 2 months who was admitted on 08/23/2019 with  acute upper GI bleed EGD revealed large esophageal varices and he underwent banding x6  Patient has been stable since without any evidence of recurrent bleeding. TIPS had been considered, and was discussed with the patient and he does not wish to proceed.  M ELD is 11  Day 3 -octreotide and Protonix-okay to discontinue  #2 anemia secondary to acute GI blood loss-stable #3 ascites -patient had paracentesis last month and  underwent paracentesis on 08/24/2019 with removal of 3.8 L.  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  Plan; patient has been very clear that he does not want extensive or aggressive management. I believe he stable for discharge to home today. He needs to follow a 2 g/sodium diet, this was discussed today and he will need a copy on discharge. I discussed the role for starting diuretics to help with ascites management, and explained that he would need to come to the office for follow-up and for follow-up labs we are to initiate diuretics.  He does not want to do this at present will follow a 2 g sodium diet. His daughter asked about what to do if he has significant increase in ascites and needs paracentesis.. This can be arranged through our office or can bring him to the emergency room which is  not the preferred method.  Ideally patient should have a beta-blocker, he does not want to be started on meds if it requires follow-up.  Repeat EGD with banding in 1 month is indicated to help eradicate varices.  Patient is agreeable to follow-up with Dr. Havery Moros for repeat EGD and banding of varices.  Our office will call him this week for follow-up procedure scheduling    Active Problems:   GI bleed     LOS: 3 days   [name] EsterwoodPA-C  08/26/2019, 12:25 PM

## 2019-08-26 NOTE — Progress Notes (Signed)
Cardiology Progress Note  Patient ID: Bobby Dixon MRN: [id number] DOB: [birth year] Date of Encounter: 08/26/2019  Primary Cardiologist: No primary care provider on file.  Subjective  No events overnight. Not interested in cardiac MR.   ROS:  All other ROS reviewed and negative. Pertinent positives noted in the HPI.     Inpatient Medications  Scheduled Meds: . sodium chloride   Intravenous Once  . Chlorhexidine Gluconate Cloth  6 each Topical Daily  . Chlorhexidine Gluconate Cloth  6 each Topical Q0600  . folic acid  1 mg Oral Daily  . mouth rinse  15 mL Mouth Rinse BID  . mupirocin ointment  1 application Nasal BID  . [START ON 08/27/2019] pantoprazole  40 mg Intravenous Q12H  . thiamine  100 mg Oral Daily   Continuous Infusions: . sodium chloride 50 mL/hr at 08/26/19 0008  . cefTRIAXone (ROCEPHIN)  IV 2 g (08/26/19 0400)  . octreotide  (SANDOSTATIN)    IV infusion 50 mcg/hr (08/26/19 0423)  . pantoprozole (PROTONIX) infusion 8 mg/hr (08/26/19 0402)   PRN Meds:    Vital Signs   Vitals:   08/25/19 1740 08/25/19 2026 08/25/19 2027 08/26/19 0629  BP: 102/73 104/78  103/67  Pulse: 64 66  70  Resp: 20 20  18   Temp: 98.4 F (36.9 C) 99.1 F (37.3 C)  98.5 F (36.9 C)  TempSrc: Oral Oral  Oral  SpO2: (!) 71% (!) 89% 91% 92%  Weight:      Height:        Intake/Output Summary (Last 24 hours) at 08/26/2019 1354 Last data filed at 08/26/2019 0600 Gross per 24 hour  Intake 1486.59 ml  Output 0 ml  Net 1486.59 ml   Last 3 Weights 08/23/2019 08/23/2019 08/23/2019  Weight (lbs) 160 lb 7.9 oz 161 lb 9.6 oz 161 lb 9.6 oz  Weight (kg) 72.8 kg 73.301 kg 73.301 kg      Physical Exam   Vitals:   08/25/19 1740 08/25/19 2026 08/25/19 2027 08/26/19 0629  BP: 102/73 104/78  103/67  Pulse: 64 66  70  Resp: 20 20  18   Temp: 98.4 F (36.9 C) 99.1 F (37.3 C)  98.5 F (36.9 C)  TempSrc: Oral Oral  Oral  SpO2: (!) 71% (!) 89% 91% 92%  Weight:      Height:          Intake/Output Summary (Last 24 hours) at 08/26/2019 1354 Last data filed at 08/26/2019 0600 Gross per 24 hour  Intake 1486.59 ml  Output 0 ml  Net 1486.59 ml    Last 3 Weights 08/23/2019 08/23/2019 08/23/2019  Weight (lbs) 160 lb 7.9 oz 161 lb 9.6 oz 161 lb 9.6 oz  Weight (kg) 72.8 kg 73.301 kg 73.301 kg    Body mass index is 21.77 kg/m.  General: Well nourished, well developed, in no acute distress Heart: Atraumatic, normal size  Eyes: PEERLA, EOMI  Neck: Supple, no JVD Endocrine: No thryomegaly Cardiac: Normal S1, S2; RRR; no murmurs, rubs, or gallops Lungs: Clear to auscultation bilaterally, no wheezing, rhonchi or rales  Abd: Soft, nontender, no hepatomegaly  Ext: No edema, pulses 2+ Musculoskeletal: No deformities, BUE and BLE strength normal and equal Skin: Warm and dry, no rashes   Neuro: Alert and oriented to person, place, time, and situation, CNII-XII grossly intact, no focal deficits  Psych: Normal mood and affect   Labs  High Sensitivity Troponin:  No results for input(s): TROPONINIHS in the last 720 hours.  Cardiac EnzymesNo results for input(s): TROPONINI in the last 168 hours. No results for input(s): TROPIPOC in the last 168 hours.  Chemistry Recent Labs  Lab 08/23/19 1302 08/24/19 0009 08/24/19 0641 08/25/19 0328 08/26/19 0634  NA 138 140 139 139 138  K 4.6 5.2* 4.8 4.4 4.3  CL 104 109 109 111 110  CO2 21* 20* 21* 21* 20*  GLUCOSE 122* 151* 127* 105* 97  BUN 34* 36* 39* 34* 25*  CREATININE 0.85 0.91 0.85 0.79 0.72  CALCIUM 9.0 8.2* 7.9* 8.1* 8.0*  PROT 7.4 6.1*  --   --   --   ALBUMIN 2.7* 2.3*  --   --   --   AST 143* 126*  --   --   --   ALT 117* 106*  --   --   --   ALKPHOS 500* 390*  --   --   --   BILITOT 1.8* 1.6*  --   --   --   GFRNONAA >60 >60 >60 >60 >60  GFRAA >60 >60 >60 >60 >60  ANIONGAP 13 11 9 7 8     Hematology Recent Labs  Lab 08/25/19 0837 08/25/19 1423 08/26/19 0634  WBC 10.9* 11.6* 10.8*  RBC 2.10* 2.37* 2.48*  HGB 7.5*  8.3* 8.6*  HCT 24.0* 28.0* 26.9*  MCV 114.3* 118.1* 108.5*  MCH 35.7* 35.0* 34.7*  MCHC 31.3 29.6* 32.0  RDW 17.0* 19.5* 19.0*  PLT 140* 118* 121*   BNPNo results for input(s): BNP, PROBNP in the last 168 hours.  DDimer No results for input(s): DDIMER in the last 168 hours.   Radiology  No results found.  Cardiac Studies  TTE 9/14  1. The left ventricle has normal systolic function with an ejection fraction of 60-65%. The cavity size was normal. Left ventricular diastolic Doppler parameters are consistent with pseudonormalization. No evidence of left ventricular regional wall  motion abnormalities.  2. No evidence of mitral valve stenosis. No significant mitral regurgitation.  3. The aortic valve is tricuspid. Mild calcification of the aortic valve. No stenosis of the aortic valve.  4. 3.7 x 2.6 cm globular posterior right atrial mass noted. The mass is in close proximity to the IVC. Suggest further evaluation via TEE or cardiac MRI, this likely represents either tumor or thrombus.  5. The right ventricle has normal systolic function. The cavity was normal. There is no increase in right ventricular wall thickness.  6. The inferior vena cava was dilated in size with <50% respiratory variability. No complete TR doppler jet so unable to estimate PA systolic pressure.  7. The aortic root is normal in size and structure.  Patient Profile  Bobby Dixon is a 58 y.o. male with a hx of cirrhosis, esophageal varices who is being seen today for the evaluation of right atrial mass at the request of Berle Mull, MD.  Assessment & Plan  1. RA Mass -mass in RA concerning for tumor vs thrombus. He reports he is not interested in cardiac MR or further cardiac care. I explained to him the severity of this situation, but he has declined further evaluation. We will be glad to see him as an outpatient and I have provided him my information.  - if he reconsiders, please re-involve [place] in his care.      [facility] will sign off.   Medication Recommendations:  Patient has refused Other recommendations (labs, testing, etc):  Cardiac MR Follow up as an outpatient:  He has declined  follow-up. Our information was given to him should he re-consider.   For questions or updates, please contact [place] Please consult [email or web address] for contact info under        Signed, [place] Bells T. Audie Box, [place]  08/26/2019 1:54 PM

## 2019-08-26 NOTE — Discharge Summary (Signed)
[facility] Discharge Summary   Patient: Bobby Dixon [id number]   PCP: System, Pcp Not In DOB: [birth year]   Date of admission: 08/23/2019   Date of discharge: 08/26/2019     Discharge Diagnoses:  Principal diagnosis Variceal bleeding  Active Problems:   GI bleed   Admitted From: home Disposition:  Home   Recommendations for Outpatient Follow-up:  1. PCP: please arrange paracentesis as needed 2. Follow up LABS/TEST:  Repeat CBC n 1 week  Follow-up [facility]. Schedule an appointment as soon as possible for a visit in 1 month(s).   Specialty: Gastroenterology Contact information: [place] [phone]       [place]. Schedule an appointment as soon as possible for a visit in 1 week(s).   Contact information: [place] [phone]         Diet recommendation: Cardiac diet  Activity: The patient is advised to gradually reintroduce usual activities,as tolerated.  Discharge Condition: good  Code Status: DNR   History of present illness: As per the H and P dictated on admission, "58 year old male known EtOH, recent diagnosis ethanol cirrhosis, recent paracentesis 9/4, multiple orthopedic issues-apparently has had endoscopy done showing moderate varices in the past Comes to emergency room 08/23/2019 multiple episodes of dark stool since this morning eight 9/12-has been taking some Aleve for the past 3 to 4 days for belly pain 1 hour prior to coming to the hospital had BRB emesis has not had this ever before Quit drinking 6 to 7 weeks ago as was not feeling well has been drinking heavily for the past 40 years Still smoker"  Hospital Course:  Summary of his active problems in the hospital is as following. 1.  Upper GI variceal bleeding. Alcoholic cirrhosis. Ascites. Acute blood loss anemia. History of  alcohol abuse, sober since last 2 months. S/P upper GI endoscopy and variceal bleeding x6. Appreciate [facility] consultation. Felt that patient is a candidate for early TIPS to prevent recurrent variceal bleeding. Patient is currently refusing any procedure or intervention. Treated with IV Protonix and IV octreotide. Please see GI note for extensive discussion as well as assessment and plan. Hemoglobin dropped from 8-7. GI thinks that the patient is not actively bleeding. We transfuse 1 PRBC. Patient has refused TIPS procedure. Discussed with patient regarding goals of care and importance of follow-up as well as prognosis. Appreciate GI recommendation regarding prognosis as it helped making the shared decision making plan for the patient. Unable to provide lasix aldacton or beta blocker due to pt's compliance concerns.  Will need repeat paracentesis soon but timing not clear to arrange it for now.  2.  Active smoker. Continue nicotine patch.  Counseled to quit smoking. Currently not ready.  3.  Acute blood loss anemia. Macrocytosis Leukocytosis  secondary to alcohol abuse and nutritional deficiency. Hemoglobin drop from baseline 13-8.0 but remained stable for now. Hemoglobin dropped further down on 08/24/2021 7.0. Transfuse 1 PRBC in the setting of hypotension. Continue Protonix.  4.  Recurrent ascites. S/P paracentesis 3.8 L removed. Albumin infusion provided. IV ceftriaxone ordered for concern for SBP prophylaxis. Continue oral Antibiotics   5. Hypotension  Blood pressure relatively soft. Likely secondary to being on octreotide as well as secondary to alcoholic cirrhosis. Given IV albumin. Also given IV blood transfusion. Once hepatitis completed blood pressure should improve.  6.  Acute hypoxia Initially was on 3  L of oxygen. Currently on room air.  7.  Goals of care discussion. I had a prolonged discussion with patient as well as patient's daughter on  08/25/2019. This discussion were held after meeting with gastroenterology. Patient mentions that he does not want to stay in the hospital and wants to leave [facility] as he is afraid that he will continue to file and gather medical bills. He also does not want to be in and out of the hospital at end of his life. Discussed with him regarding prognosis information provided by GI diet with his current meld score if the patient continues to follow medical treatment his 8-month survival is 6% and with TIPS and other medical intervention his chances of 1 year survival is 60%. I stressed significantly about the importance of follow-up and medical treatment. I offered him the option of considering hospice should he decide that he does not want any medical treatment or intervention. While he therapy is not ready to make any decision I recommended to continue to engage with him regarding goals of care whenever he follows up. Daughter was at bedside who stressed that patient currently is not ready to consider TIPS. Daughter will make sure that the patient follows up with GI for repeat endoscopy in a potential banding. Currently patient would like to go home. Currently does not want to consider palliative care or hospice.  8.  Right atrial mass. Echocardiogram was ordered as a part of work-up for cryptogenic cirrhosis. 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 Potentially represent either tumor or thrombus. Currently anticoagulation is contraindicated given patient's presentation with GI bleed and varices. Patient does not want any further work-up including cardiac MR or any other work-up to rule out malignancy at present. Appreciate cardiology input.  Personally discussed with cardiology  Patient was ambulatory without any assistance. On the day of the discharge the patient's vitals were stable, and no other acute medical condition were reported by patient. the  patient was felt safe to be discharge at Home with no therapy needed on discharge.  Consultants: gastroenterology cardiology  Procedures: Echocardiogram  Upper GI endoscopy and variceal banding.  DISCHARGE MEDICATION: Allergies as of 08/26/2019   No Known Allergies     Medication List    TAKE these medications   acetaminophen 325 MG tablet Commonly known as: TYLENOL Take 650 mg by mouth every 6 (six) hours as needed for mild pain, moderate pain or headache.   cefdinir 300 MG capsule Commonly known as: OMNICEF Take 1 capsule (300 mg total) by mouth 2 (two) times daily for 4 days.   folic acid 1 MG tablet Commonly known as: FOLVITE Take 1 mg by mouth daily.   ondansetron 8 MG tablet Commonly known as: ZOFRAN Take 8 mg by mouth every 8 (eight) hours as needed for nausea/vomiting.   pantoprazole 40 MG tablet Commonly known as: PROTONIX Take 1 tablet (40 mg total) by mouth 2 (two) times daily before a meal. What changed: when to take this   Vitamin B1 100 MG Tabs Take 1 tablet by mouth daily.      No Known Allergies Discharge Instructions    Diet - low sodium heart healthy   Complete by: As directed    Discharge instructions   Complete by: As directed    It is important that you read the given instructions as well as go over your medication list with RN to help you understand your  care after this hospitalization.  Please follow-up with PCP in 1-2 weeks.  Please note that NO REFILLS for any discharge medications will be authorized once you are discharged, as it is imperative that you return to your primary care physician (or establish a relationship with a primary care physician if you do not have one) for your aftercare needs so that they can reassess your need for medications and monitor your lab values.  Please request your primary care physician to go over all Hospital Tests and Procedure/Radiological results at the follow up. Please get all Hospital records sent to  your PCP by signing hospital release before you go home.   Do not drive, operating heavy machinery, perform activities at heights, swimming or participation in water activities or provide baby sitting services; until you have been seen by Primary Care Physician or a Neurologist and are cleared to do such activities.  Do not take more than prescribed Pain, Sleep and Anxiety Medications.  You were cared for by a hospitalist during your hospital stay. If you have any questions about your discharge medications or the care you received while you were in the hospital after you are discharged, you can call the unit @UNIT @ you were admitted to and ask to speak with the hospitalist Berle Mull. Ask for Hospitalist on call if the hospitalist that took care of you is not available.   Once you are discharged, your primary care physician will handle any further medical issues.  You Must read complete instructions/literature along with all the possible adverse reactions/side effects for all the Medicines you take and that have been prescribed to you. Take any new Medicines after you have completely understood and accept all the possible adverse reactions/side effects.  If you have smoked or chewed Tobacco in the last 2 yrs please STOP smoking STOP any Recreational drug use.  If you drink alcohol, please safely STOP the use. Do not drive, operating heavy machinery, perform activities at heights, swimming or participation in water activities or provide baby sitting services under influence.  Wear Seat belts while driving.   Increase activity slowly   Complete by: As directed      Discharge Exam: Filed Weights   08/23/19 1214 08/23/19 1614 08/23/19 1749  Weight: 73.3 kg 73.3 kg 72.8 kg   Vitals:   08/26/19 0629 08/26/19 1405  BP: 103/67 108/77  Pulse: 70 68  Resp: 18 (!) 24  Temp: 98.5 F (36.9 C) 98.5 F (36.9 C)  SpO2: 92% 96%   General: Appear in no distress, no Rash; Oral Mucosa Clear, moist.  no Abnormal Mass Or lumps Cardiovascular: S1 and S2 Present, no Murmur, Respiratory: normal respiratory effort, Bilateral Air entry present and Clear to Auscultation, no Crackles, no wheezes Abdomen: Bowel Sound present, Soft and no tenderness, no hernia Extremities: no Pedal edema, no calf tenderness Neurology: alert and oriented to time, place, and person affect appropriate.  The results of significant diagnostics from this hospitalization (including imaging, microbiology, ancillary and laboratory) are listed below for reference.    Significant Diagnostic Studies: US Paracentesis  Result Date: 08/24/2019 INDICATION: Ascites secondary to alcoholic cirrhosis. Request for diagnostic and therapeutic paracentesis. EXAM: ULTRASOUND GUIDED PARACENTESIS MEDICATIONS: 1% lidocaine 10 mL COMPLICATIONS: None immediate. PROCEDURE: Informed written consent was obtained from the patient after a discussion of the risks, benefits and alternatives to treatment. A timeout was performed prior to the initiation of the procedure. Initial ultrasound scanning demonstrates a moderate amount of ascites within the right lower abdominal  quadrant. The right lower abdomen was prepped and draped in the usual sterile fashion. 1% lidocaine with epinephrine was used for local anesthesia. Following this, a 19 gauge, 7-cm, Yueh catheter was introduced. An ultrasound image was saved for documentation purposes. The paracentesis was performed. The catheter was removed and a dressing was applied. The patient tolerated the procedure well without immediate post procedural complication. FINDINGS: A total of approximately 3.8 of clear yellow fluid was removed. Samples were sent to the laboratory as requested by the clinical team. IMPRESSION: Successful ultrasound-guided paracentesis yielding 3.8 liters of peritoneal fluid. Read by: Gareth Eagle, PA-C Electronically Signed   By: Jerilynn Mages.  Shick M.D.   On: 08/24/2019 13:12   US Paracentesis  Result  Date: 08/15/2019 INDICATION: Patient with history of abdominal pain/distension, ascites. Request made for diagnostic and therapeutic paracentesis. EXAM: ULTRASOUND GUIDED DIAGNOSTIC AND THERAPEUTIC PARACENTESIS MEDICATIONS: None COMPLICATIONS: None immediate. PROCEDURE: Informed written consent was obtained from the patient after a discussion of the risks, benefits and alternatives to treatment. A timeout was performed prior to the initiation of the procedure. Initial ultrasound scanning demonstrates a moderate amount of ascites within the right lower abdominal quadrant. The right lower abdomen was prepped and draped in the usual sterile fashion. 1% lidocaine was used for local anesthesia. Following this, a 19 gauge, 7-cm, Yueh catheter was introduced. An ultrasound image was saved for documentation purposes. The paracentesis was performed. The catheter was removed and a dressing was applied. The patient tolerated the procedure well without immediate post procedural complication. FINDINGS: A total of approximately 3.6 liters of yellow fluid was removed. Samples were sent to the laboratory as requested by the clinical team. IMPRESSION: Successful ultrasound-guided diagnostic and therapeutic paracentesis yielding 3.6 liters of peritoneal fluid. Read by: Rowe Robert, PA-C Electronically Signed   By: Markus Daft M.D.   On: 08/15/2019 15:54   Dg Chest Portable 1 View  Result Date: 08/15/2019 CLINICAL DATA:  Dyspnea EXAM: PORTABLE CHEST 1 VIEW COMPARISON:  07/26/2019 FINDINGS: The heart size and mediastinal contours are within normal limits. Redemonstrated elevation of the left hemidiaphragm with associated atelectasis or scarring. There may be a small left pleural effusion. The visualized skeletal structures are unremarkable. IMPRESSION: Redemonstrated elevation of the left hemidiaphragm with associated atelectasis or scarring. There may be a small left pleural effusion. No definite acute airspace opacity. PA and  lateral radiographs may be helpful to further evaluate. Electronically Signed   By: Eddie Candle M.D.   On: 08/15/2019 13:30    Microbiology: Recent Results (from the past 240 hour(s))  SARS Coronavirus 2 [facility] order, Performed in [facility] hospital lab) Nasopharyngeal Nasopharyngeal Swab     Status: None   Collection Time: 08/23/19  2:50 PM   Specimen: Nasopharyngeal Swab  Result Value Ref Range Status   SARS Coronavirus 2 NEGATIVE NEGATIVE Final    Comment: (NOTE) If result is NEGATIVE SARS-CoV-2 target nucleic acids are NOT DETECTED. The SARS-CoV-2 RNA is generally detectable in upper and lower  respiratory specimens during the acute phase of infection. The lowest  concentration of SARS-CoV-2 viral copies this assay can detect is 250  copies / mL. A negative result does not preclude SARS-CoV-2 infection  and should not be used as the sole basis for treatment or other  patient management decisions.  A negative result may occur with  improper specimen collection / handling, submission of specimen other  than nasopharyngeal swab, presence of viral mutation(s) within the  areas targeted by this assay, and inadequate  number of viral copies  (<250 copies / mL). A negative result must be combined with clinical  observations, patient history, and epidemiological information. If result is POSITIVE SARS-CoV-2 target nucleic acids are DETECTED. The SARS-CoV-2 RNA is generally detectable in upper and lower  respiratory specimens dur ing the acute phase of infection.  Positive  results are indicative of active infection with SARS-CoV-2.  Clinical  correlation with patient history and other diagnostic information is  necessary to determine patient infection status.  Positive results do  not rule out bacterial infection or co-infection with other viruses. If result is PRESUMPTIVE POSTIVE SARS-CoV-2 nucleic acids MAY BE PRESENT.   A presumptive positive result was obtained on the  submitted specimen  and confirmed on repeat testing.  While 2019 novel coronavirus  (SARS-CoV-2) nucleic acids may be present in the submitted sample  additional confirmatory testing may be necessary for epidemiological  and / or clinical management purposes  to differentiate between  SARS-CoV-2 and other Sarbecovirus currently known to infect humans.  If clinically indicated additional testing with an alternate test  methodology [phone]) is advised. The SARS-CoV-2 RNA is generally  detectable in upper and lower respiratory sp ecimens during the acute  phase of infection. The expected result is Negative. Fact Sheet for Patients:  StrictlyIdeas.no Fact Sheet for Healthcare Providers: BankingDealers.co.za This test is not yet approved or cleared by the [place] FDA and has been authorized for detection and/or diagnosis of SARS-CoV-2 by FDA under an Emergency Use Authorization (EUA).  This EUA will remain in effect (meaning this test can be used) for the duration of the COVID-19 declaration under Section 564(b)(1) of the Act, 21 U.S.C. section 360bbb-3(b)(1), unless the authorization is terminated or revoked sooner. Performed at [facility], [place] [address], [place], [place] [address]   MRSA PCR Screening     Status: Abnormal   Collection Time: 08/23/19  5:52 PM   Specimen: Nasal Mucosa; Nasopharyngeal  Result Value Ref Range Status   MRSA by PCR POSITIVE (A) NEGATIVE Final    Comment:        The GeneXpert MRSA Assay (FDA approved for NASAL specimens only), is one component of a comprehensive MRSA colonization surveillance program. It is not intended to diagnose MRSA infection nor to guide or monitor treatment for MRSA infections. RESULT CALLED TO, READ BACK BY AND VERIFIED WITHMalachy Moan [id number] @ [place] Performed at [place] [address], [place], [place]  [address]   Acid Fast Smear (AFB)     Status: None   Collection Time: 08/24/19 12:38 PM   Specimen: Paracentesis; Abdominal Fluid  Result Value Ref Range Status   AFB Specimen Processing Concentration  Final   Acid Fast Smear Negative  Final    Comment: (NOTE) Performed At: [facility] [address] [place], [place] [id number] Rush Farmer MD [id number]   Culture, body fluid-bottle     Status: None (Preliminary result)   Collection Time: 08/24/19 12:38 PM   Specimen: Paracentesis  Result Value Ref Range Status   Specimen Description PARACENTESIS  Final   Special Requests NONE  Final   Culture   Final    NO GROWTH 2 DAYS Performed at [facility], [address], [place], [place] [address]    Report Status PENDING  Incomplete  Gram stain     Status: None   Collection Time: 08/24/19 12:38 PM   Specimen: Paracentesis  Result Value Ref Range Status  Specimen Description PARACENTESIS  Final   Special Requests NONE  Final   Gram Stain   Final    FEW WBC PRESENT, PREDOMINANTLY MONONUCLEAR NO ORGANISMS SEEN Performed at [facility], [place] [address], [place], [place] [address]    Report Status 08/24/2019 FINAL  Final     Labs: CBC: Recent Labs  Lab 08/24/19 0641 08/24/19 1318 08/25/19 0328 08/25/19 0837 08/25/19 1423 08/26/19 0634  WBC 12.1*  --  12.0* 10.9* 11.6* 10.8*  HGB 8.0* 8.3* 7.1* 7.5* 8.3* 8.6*  HCT 25.2* 26.2* 22.9* 24.0* 28.0* 26.9*  MCV 110.0*  --  113.9* 114.3* 118.1* 108.5*  PLT 149*  --  126* 140* 118* 123XX123*   Basic Metabolic Panel: Recent Labs  Lab 08/23/19 1302 08/24/19 0009 08/24/19 0641 08/25/19 0328 08/26/19 0634  NA 138 140 139 139 138  K 4.6 5.2* 4.8 4.4 4.3  CL 104 109 109 111 110  CO2 21* 20* 21* 21* 20*  GLUCOSE 122* 151* 127* 105* 97  BUN 34* 36* 39* 34* 25*  CREATININE 0.85 0.91 0.85 0.79 0.72  CALCIUM 9.0 8.2* 7.9* 8.1* 8.0*  MG  --   --   --  2.3  --    Liver Function Tests: Recent Labs  Lab  08/23/19 1302 08/24/19 0009  AST 143* 126*  ALT 117* 106*  ALKPHOS 500* 390*  BILITOT 1.8* 1.6*  PROT 7.4 6.1*  ALBUMIN 2.7* 2.3*   No results for input(s): LIPASE, AMYLASE in the last 168 hours. No results for input(s): AMMONIA in the last 168 hours. Cardiac Enzymes: No results for input(s): CKTOTAL, CKMB, CKMBINDEX, TROPONINI in the last 168 hours. BNP (last 3 results) No results for input(s): BNP in the last 8760 hours. CBG: Recent Labs  Lab 08/24/19 0800  GLUCAP 120*    Time spent: 35 minutes  Signed:  Berle Mull  [facility] 08/26/2019 11:10 PM

## 2019-08-26 NOTE — TOC Initial Note (Signed)
Transition of Care [facility]) - Initial/Assessment Note    Patient Details  Name: Bobby Dixon MRN: [id number] Date of Birth: 08-08-1961  [facility] CM/SW Contact:    Nila Nephew, LCSW Phone Number: [phone] 08/26/2019, 12:37 PM  Clinical Narrative:   Met with pt and his daughter at bedside due to consult for potential interest in hospice services as well as pt expressing concern about being inpatient and uninsured. Explained that financial counseling typically assists with medicaid application when uninsured pts admitted- pt confirms he and daughter were contacted by financial counseling. Since consult, pt has decided not interested in hospice care.  Explains he has been given prognosis of several months depending on interventions decided upon.  Pt is going to follow up with [facility]-daughter is highly involved and assisting him with this. CSW inquired about PCP- reports he has been referred to [facility] and did intake appointment there after last encounter- reports he can make follow up appointment there but reports an understanding from initial visit is that primary care would not be able to address his advanced disease.  Pt and daughter both report feeling confident in pt's plan of care.  Re: basic needs, reports pt has access to food/transportation, can complete ADLs, and ambulate safely.         Activities of Daily Living Home Assistive Devices/Equipment: Dentures (specify type) ADL Screening (condition at time of admission) Patient's cognitive ability adequate to safely complete daily activities?: Yes Is the patient deaf or have difficulty hearing?: No Does the patient have difficulty seeing, even when wearing glasses/contacts?: No Does the patient have difficulty concentrating, remembering, or making decisions?: No Patient able to express need for assistance with ADLs?: Yes Does the patient have difficulty dressing or bathing?: No Independently performs  ADLs?: Yes (appropriate for developmental age) Does the patient have difficulty walking or climbing stairs?: No Weakness of Legs: None Weakness of Arms/Hands: None  Permission Sought/Granted    verbal permission granted      Daughter [name] in room         Emotional Assessment      engaged, pleasant        Admission diagnosis:  Acute GI bleeding [K92.2] Patient Active Problem List   Diagnosis Date Noted  . GI bleed 08/23/2019  . Upper GI bleed   . Alcoholic cirrhosis of liver with ascites ([place])   . Bleeding esophageal varices ([place])   . Acute GI bleeding 07/26/2019  . Abdominal discomfort 07/26/2019  . Abdominal pain    PCP:  System, Pcp Not In Pharmacy:   [facility] [address], [place] [address] Phone: [phone] Fax: [phone]     Social Determinants of Health (SDOH) Interventions    Readmission Risk Interventions No flowsheet data found.

## 2019-08-26 NOTE — Discharge Instructions (Signed)
Ascites ° °Ascites is a collection of too much fluid in the abdomen. Ascites can range from mild to severe. If ascites is not treated, it can get worse. °What are the causes? °This condition may be caused by: °· A liver condition called cirrhosis. This is the most common cause of ascites. °· Long-term (chronic) or alcoholic hepatitis. °· Infection or inflammation in the abdomen. °· Cancer in the abdomen. °· Heart failure. °· Kidney disease. °· Inflammation of the pancreas. °· Clots in the veins of the liver. °What are the signs or symptoms? °Symptoms of this condition include: °· A feeling of fullness in the abdomen. This is common. °· An increase in the size of the abdomen or waist. °· Swelling in the legs. °· Swelling of the scrotum (in men). °· Difficulty breathing. °· Pain in the abdomen. °· Sudden weight gain. °If the condition is mild, you may not have symptoms. °How is this diagnosed? °This condition is diagnosed based on your medical history and a physical exam. Your health care provider may order imaging tests, such as an ultrasound or CT scan of your abdomen. °How is this treated? °Treatment for this condition depends on the cause of the ascites. It may include: °· Taking a pill to make you urinate. This is called a water pill (diuretic pill). °· Strictly reducing your salt (sodium) intake. Salt can cause extra fluid to be kept (retained) in the body, and this makes ascites worse. °· Having a procedure to remove fluid from your abdomen (paracentesis). °· Having a procedure that connects two of the major veins within your liver and relieves pressure on your liver. This is called a TIPS procedure (transjugular intrahepatic portosystemic shunt procedure). °· Placement of a drainage catheter (peritoneovenous shunt) to manage the extra fluid in the abdomen. °Ascites may go away or improve when the condition that caused it is treated. °Follow these instructions at home: °· Keep track of your weight. To do this,  weigh yourself at the same time every day and write down your weight. °· Keep track of how much you drink and any changes in how much or how often you urinate. °· Follow any instructions that your health care provider gives you about how much to drink. °· Try not to eat salty (high-sodium) foods. °· Take over-the-counter and prescription medicines only as told by your health care provider. °· Keep all follow-up visits as told by your health care provider. This is important. °· Report any changes in your health to your health care provider, especially if you develop new symptoms or your symptoms get worse. °Contact a health care provider if: °· You gain more than 3 lb (1.36 kg) in 3 days. °· Your waist size increases. °· You have new swelling in your legs. °· The swelling in your legs gets worse. °Get help right away if: °· You have a fever. °· You are confused. °· You have new or worsening breathing trouble. °· You have new or worsening pain in your abdomen. °· You have new or worsening swelling in the scrotum (in men). °Summary °· Ascites is a collection of too much fluid in the abdomen. °· Ascites may be caused by various conditions, such as cirrhosis, hepatitis, cancer, or congestive heart failure. °· Symptoms may include swelling of the abdomen and other areas due to extra fluid in the body. °· Treatments may involve dietary changes, medicines, or procedures. °This information is not intended to replace advice given to you by your health care   provider. Make sure you discuss any questions you have with your health care provider. °Document Released: 11/27/2005 Document Revised: 10/29/2018 Document Reviewed: 08/09/2017 °Elsevier Patient Education © 2020 [employer] Inc. ° °

## 2019-08-26 NOTE — Progress Notes (Signed)
Patient given discharge instructions, and verbalized an understanding of all discharge instructions.  Patient agrees with discharge plan, and is being discharged in stable medical condition.  Patient given transportation via wheelchair. 

## 2019-08-27 LAB — TYPE AND SCREEN
ABO/RH(D): A POS
Antibody Screen: NEGATIVE
Unit division: 0
Unit division: 0

## 2019-08-27 LAB — BPAM RBC
Blood Product Expiration Date: 202010072359
Blood Product Expiration Date: 202010072359
ISSUE DATE / TIME: 202009121626
ISSUE DATE / TIME: 202009141036
Unit Type and Rh: 6200
Unit Type and Rh: 6200

## 2019-08-28 ENCOUNTER — Other Ambulatory Visit: Payer: Self-pay

## 2019-08-28 DIAGNOSIS — I8501 Esophageal varices with bleeding: Secondary | ICD-10-CM

## 2019-08-29 LAB — CULTURE, BODY FLUID W GRAM STAIN -BOTTLE: Culture: NO GROWTH

## 2019-08-31 ENCOUNTER — Emergency Department (HOSPITAL_COMMUNITY)
Admission: EM | Admit: 2019-08-31 | Discharge: 2019-08-31 | Disposition: A | Discharge status: Home / Self Care | Payer: Medicaid Other | Attending: Emergency Medicine | Admitting: Emergency Medicine

## 2019-08-31 ENCOUNTER — Other Ambulatory Visit: Payer: Self-pay

## 2019-08-31 ENCOUNTER — Encounter (HOSPITAL_COMMUNITY): Payer: Self-pay | Admitting: Emergency Medicine

## 2019-08-31 ENCOUNTER — Emergency Department (HOSPITAL_COMMUNITY): Payer: Medicaid Other

## 2019-08-31 DIAGNOSIS — R188 Other ascites: Secondary | ICD-10-CM | POA: Diagnosis present

## 2019-08-31 DIAGNOSIS — F17200 Nicotine dependence, unspecified, uncomplicated: Secondary | ICD-10-CM | POA: Insufficient documentation

## 2019-08-31 DIAGNOSIS — K7031 Alcoholic cirrhosis of liver with ascites: Secondary | ICD-10-CM | POA: Insufficient documentation

## 2019-08-31 LAB — COMPREHENSIVE METABOLIC PANEL
ALT: 116 U/L — ABNORMAL HIGH (ref 0–44)
AST: 116 U/L — ABNORMAL HIGH (ref 15–41)
Albumin: 2.9 g/dL — ABNORMAL LOW (ref 3.5–5.0)
Alkaline Phosphatase: 370 U/L — ABNORMAL HIGH (ref 38–126)
Anion gap: 8 (ref 5–15)
BUN: 17 mg/dL (ref 6–20)
CO2: 22 mmol/L (ref 22–32)
Calcium: 8.9 mg/dL (ref 8.9–10.3)
Chloride: 105 mmol/L (ref 98–111)
Creatinine, Ser: 0.72 mg/dL (ref 0.61–1.24)
GFR calc Af Amer: >60 mL/min (ref >60)
GFR calc non Af Amer: >60 mL/min (ref >60)
Glucose, Bld: 131 mg/dL — ABNORMAL HIGH (ref 70–99)
Potassium: 4.2 mmol/L (ref 3.5–5.1)
Sodium: 135 mmol/L (ref 135–145)
Total Bilirubin: 2.2 mg/dL — ABNORMAL HIGH (ref 0.3–1.2)
Total Protein: 7.2 g/dL (ref 6.5–8.1)

## 2019-08-31 LAB — CBC WITH DIFFERENTIAL/PLATELET
Abs Immature Granulocytes: 0.18 10*3/uL — ABNORMAL HIGH (ref 0.00–0.07)
Basophils Absolute: 0.1 10*3/uL (ref 0.0–0.1)
Basophils Relative: 1 %
Eosinophils Absolute: 0.3 10*3/uL (ref 0.0–0.5)
Eosinophils Relative: 3 %
HCT: 29.6 % — ABNORMAL LOW (ref 39.0–52.0)
Hemoglobin: 10 g/dL — ABNORMAL LOW (ref 13.0–17.0)
Immature Granulocytes: 2 %
Lymphocytes Relative: 24 %
Lymphs Abs: 2.6 10*3/uL (ref 0.7–4.0)
MCH: 38.2 pg — ABNORMAL HIGH (ref 26.0–34.0)
MCHC: 33.8 g/dL (ref 30.0–36.0)
MCV: 113 fL — ABNORMAL HIGH (ref 80.0–100.0)
Monocytes Absolute: 0.8 10*3/uL (ref 0.1–1.0)
Monocytes Relative: 7 %
Neutro Abs: 6.9 10*3/uL (ref 1.7–7.7)
Neutrophils Relative %: 63 %
Platelets: 152 10*3/uL (ref 150–400)
RBC: 2.62 MIL/uL — ABNORMAL LOW (ref 4.22–5.81)
RDW: 17.2 % — ABNORMAL HIGH (ref 11.5–15.5)
WBC: 10.9 10*3/uL — ABNORMAL HIGH (ref 4.0–10.5)
nRBC: 0 % (ref 0.0–0.2)

## 2019-08-31 MED ORDER — LIDOCAINE HCL 1 % IJ SOLN
INTRAMUSCULAR | Status: AC
  Filled 2019-08-31: qty 20

## 2019-08-31 NOTE — Procedures (Signed)
  Procedure: US paracentesis RUQ 5L removed EBL:   minimal Complications:  none immediate  See full dictation in [facility].  Dillard Cannon MD Main # [phone] Pager  [phone]

## 2019-08-31 NOTE — ED Provider Notes (Signed)
[facility] DEPT Provider Note   CSN: [id number] Arrival date & time: 08/31/19  1128     History   Chief Complaint Chief Complaint  Patient presents with  . Ascites    HPI Bobby Dixon is a 58 y.o. male.     Bobby Dixon is a 58 y.o. male with hx of alcoholic cirrhosis with ascites, GI bleeding, presenting with worsening ascites. Typically gets paracentesis once a week, last had paracentesis on 9/13, at this time he was also admitted to the hospital for bleeding esophageal varices.  He reports they took off about 3.8 L.  He reports that the cirrhosis was a fairly new diagnosis, 6 weeks ago, they are working on getting set up with outpatient GI and have an upcoming appointment in the meantime if told him that when he needs paracentesis his best option is to go to the emergency department.  He reports that over the past few days he has noted increasing abdominal distention he reports it is uncomfortable and causes pain in his low back and that has no move it difficult for him to take a deep breath, activities like taking a shower exostosectomy, at home.  He denies any abdominal pain, no fevers, vomiting, diarrhea, he has had no additional episodes of hematemesis or blood in the stools.  He has mild jaundice at baseline which he and his daughter report is no worse than usual.  No other aggravating or alleviating factors.     Past Medical History:  Diagnosis Date  . Cirrhosis [facility])     Patient Active Problem List   Diagnosis Date Noted  . GI bleed 08/23/2019  . Upper GI bleed   . Alcoholic cirrhosis of liver with ascites ([place])   . Bleeding esophageal varices ([place])   . Acute GI bleeding 07/26/2019  . Abdominal discomfort 07/26/2019  . Abdominal pain     Past Surgical History:  Procedure Laterality Date  . ESOPHAGEAL BANDING  08/23/2019   Procedure: ESOPHAGEAL BANDING;  Surgeon: Yetta Flock, MD;  Location: [facility];  Service:  Gastroenterology;;  . ESOPHAGOGASTRODUODENOSCOPY (EGD) WITH PROPOFOL N/A 08/23/2019   Procedure: ESOPHAGOGASTRODUODENOSCOPY (EGD) WITH PROPOFOL;  Surgeon: Yetta Flock, MD;  Location: [facility];  Service: Gastroenterology;  Laterality: N/A;        Home Medications    Prior to Admission medications   Medication Sig Start Date End Date Taking? Authorizing Provider  acetaminophen (TYLENOL) 325 MG tablet Take 650 mg by mouth every 6 (six) hours as needed for mild pain, moderate pain or headache.    [provider]  folic acid (FOLVITE) 1 MG tablet Take 1 mg by mouth daily. 07/27/19   [provider]  ondansetron (ZOFRAN) 8 MG tablet Take 8 mg by mouth every 8 (eight) hours as needed for nausea/vomiting. 08/06/19   [provider]  pantoprazole (PROTONIX) 40 MG tablet Take 1 tablet (40 mg total) by mouth 2 (two) times daily before a meal. 08/26/19   Lavina Hamman, MD  Thiamine HCl (VITAMIN B1) 100 MG TABS Take 1 tablet by mouth daily. 07/27/19   [provider]    Family History Family History  Problem Relation Age of Onset  . Diabetes Mellitus II Neg Hx     Social History Social History   Tobacco Use  . Smoking status: Current Every Day Smoker  . Smokeless tobacco: Never Used  Substance Use Topics  . Alcohol use: Not Currently    Comment:  quit two weeks ago  . Drug use: Not on file     Allergies   Patient has no known allergies.   Review of Systems Review of Systems  Constitutional: Negative for chills and fever.  HENT: Negative.   Respiratory: Positive for shortness of breath. Negative for cough.   Cardiovascular: Negative for chest pain.  Gastrointestinal: Positive for abdominal distention. Negative for abdominal pain, blood in stool, constipation, diarrhea, nausea and vomiting.  Genitourinary: Negative for dysuria and frequency.  Musculoskeletal: Positive for back pain. Negative for arthralgias and myalgias.  Skin: Negative  for color change and rash.  Neurological: Negative for dizziness, syncope and light-headedness.  Psychiatric/Behavioral: Negative for confusion.  All other systems reviewed and are negative.    Physical Exam Updated Vital Signs BP (!) 131/94   Pulse 98   Temp 97.7 F (36.5 C) (Oral)   Resp 18   SpO2 95%   Physical Exam Vitals signs and nursing note reviewed.  Constitutional:      General: He is not in acute distress.    Appearance: He is well-developed. He is not diaphoretic.  HENT:     Head: Normocephalic and atraumatic.  Eyes:     General:        Right eye: No discharge.        Left eye: No discharge.     Pupils: Pupils are equal, round, and reactive to light.  Neck:     Musculoskeletal: Neck supple.  Cardiovascular:     Rate and Rhythm: Normal rate and regular rhythm.     Heart sounds: Normal heart sounds.  Pulmonary:     Effort: Pulmonary effort is normal. No respiratory distress.     Breath sounds: Normal breath sounds. No wheezing or rales.  Abdominal:     General: Bowel sounds are normal. There is distension.     Palpations: Abdomen is soft. There is no mass.     Tenderness: There is no abdominal tenderness. There is no guarding.     Comments: Abdomen is distended, but soft, + fluid wave, abdomen without focal tenderness, no guarding or peritoneal signs.  Bowel sounds present throughout.  Musculoskeletal:        General: No deformity.     Right lower leg: No edema.     Left lower leg: No edema.     Comments: Bilateral lower extremities without edema  Skin:    General: Skin is warm and dry.     Capillary Refill: Capillary refill takes less than 2 seconds.     Coloration: Skin is jaundiced.     Comments: Very slight jaundice  Neurological:     Mental Status: He is alert and oriented to person, place, and time.     Coordination: Coordination normal.     Comments: Speech is clear, able to follow commands Moves extremities without ataxia, coordination intact   Psychiatric:        Mood and Affect: Mood normal.        Behavior: Behavior normal.      ED Treatments / Results  Labs (all labs ordered are listed, but only abnormal results are displayed) Labs Reviewed - No data to display  EKG None  Radiology No results found.  Procedures Procedures (including critical care time)  Medications Ordered in ED Medications - No data to display   Initial Impression / Assessment and Plan / ED Course  I have reviewed the triage vital signs and the nursing notes.  Pertinent labs & imaging  results that were available during my care of the patient were reviewed by me and considered in my medical decision making (see chart for details).  58 year old male with alcoholic cirrhosis, coming in with worsening ascites, since diagnosis patient has been requiring paracentesis about once a week.  He is working on getting set up with outpatient GI so that they can schedule these regularly, but in the meantime he has been directed to the emergency department.  He reports that over the past few days abdominal distention has worsened causing pain in his low back and shortness of breath simple activities like taking a shower.  Last drainage was 1 week ago, removed 3.8 L.  He has not had any fevers, focal abdominal pain.  On arrival he has normal vitals and exam is not concerning for SBP.  Checked basic lab work which appears to be at baseline.  Will consult IR for symptomatic paracentesis.  Case discussed with Dr. Vernard Gambles with IR, they will be able for paracentesis per patient for symptomatic ascites.  Patient had paracentesis performed, they removed 5 L of fluid and he reports significant improvement in symptoms and shortness of breath.  At this time, I have encouraged him and his daughter to continue to try to get to the appointment with GI so that they can hopefully set up regular paracentesis for him.  Return precautions discussed.  They expressed understanding.   Discharged home condition.  Patient discussed with Dr. Langston Masker, who saw patient as well and agrees with plan.  Final Clinical Impressions(s) / ED Diagnoses   Final diagnoses:  Ascites of liver    ED Discharge Orders    None       Jacqlyn Larsen, [place] 09/02/19 1002    Wyvonnia Dusky, MD 09/03/19 1048

## 2019-08-31 NOTE — Discharge Instructions (Signed)
5 L was drained from your abdomen today.  Please follow-up with your GI doctor as planned they can hopefully work on getting these paracentesis scheduled for you as an outpatient so that you do not have to continue to return to the emergency department.  If you have abdominal pain, fevers, vomiting or any other new or concerning symptoms do not hesitate to return to the ED.

## 2019-08-31 NOTE — ED Triage Notes (Signed)
Patient ascites r/t alcoholic cirrhosis states worsening bloating since d/c on 9/15. BIB daughter. Patient states "I need to be drained."

## 2019-09-01 ENCOUNTER — Telehealth: Payer: Self-pay

## 2019-09-01 NOTE — Telephone Encounter (Signed)
-----   Message from Alfredia Ferguson, PA-C sent at 08/26/2019  1:38 PM EDT ----- Regarding: follow up EGD with banding of varices Bobby Dixon, this pt is being discharged today.  He has been stable.  Please see the notes, he is not agreeable to starting any diuretics or outpatient follow-up but is agreeable to follow-up for repeat EGD with banding of varices.  Please get patient scheduled for EGD with banding in about 4 weeks with Dr. Havery Moros at [facility].Patient and daughter will need to be called with instructions and appointment time Thank you

## 2019-09-01 NOTE — Telephone Encounter (Signed)
Patient scheduled for EGD with possible banding at [facility] on 09/16/19 at 9:30am,  patient to arrive at [facility] testing scheduled for 09/12/19 in the am at [facility]. Let daughter know he needs to go into the [place] and tell tester he is there for  Pre-procedure testing for the hospital also let her know he would need to Quarantine. She agrees. Instructions given over the phone to daughter ([name]) and letter mailed.

## 2019-09-01 NOTE — Telephone Encounter (Signed)
-----   Message from Alfredia Ferguson, PA-C sent at 08/26/2019  1:38 PM EDT ----- Regarding: follow up EGD with banding of varices Bobby Dixon, this pt is being discharged today.  He has been stable.  Please see the notes, he is not agreeable to starting any diuretics or outpatient follow-up but is agreeable to follow-up for repeat EGD with banding of varices.  Please get patient scheduled for EGD with banding in about 4 weeks with Dr. Havery Moros at [facility].Patient and daughter will need to be called with instructions and appointment time Thank you

## 2019-09-01 NOTE — Telephone Encounter (Signed)
See phone note

## 2019-09-03 LAB — TOTAL BILIRUBIN, BODY FLUID: Total bilirubin, fluid: 0.2 mg/dL

## 2019-09-09 ENCOUNTER — Emergency Department (HOSPITAL_COMMUNITY): Payer: Medicaid Other

## 2019-09-09 ENCOUNTER — Emergency Department (HOSPITAL_COMMUNITY)
Admission: EM | Admit: 2019-09-09 | Discharge: 2019-09-09 | Disposition: A | Discharge status: Home / Self Care | Payer: Medicaid Other | Attending: Emergency Medicine | Admitting: Emergency Medicine

## 2019-09-09 ENCOUNTER — Other Ambulatory Visit: Payer: Self-pay

## 2019-09-09 ENCOUNTER — Encounter (HOSPITAL_COMMUNITY): Payer: Self-pay | Admitting: Emergency Medicine

## 2019-09-09 DIAGNOSIS — R140 Abdominal distension (gaseous): Secondary | ICD-10-CM | POA: Insufficient documentation

## 2019-09-09 DIAGNOSIS — N186 End stage renal disease: Secondary | ICD-10-CM | POA: Diagnosis not present

## 2019-09-09 DIAGNOSIS — R188 Other ascites: Secondary | ICD-10-CM | POA: Diagnosis not present

## 2019-09-09 DIAGNOSIS — Z79899 Other long term (current) drug therapy: Secondary | ICD-10-CM | POA: Diagnosis not present

## 2019-09-09 LAB — COMPREHENSIVE METABOLIC PANEL
ALT: 135 U/L — ABNORMAL HIGH (ref 0–44)
AST: 139 U/L — ABNORMAL HIGH (ref 15–41)
Albumin: 2.9 g/dL — ABNORMAL LOW (ref 3.5–5.0)
Alkaline Phosphatase: 625 U/L — ABNORMAL HIGH (ref 38–126)
Anion gap: 11 (ref 5–15)
BUN: 17 mg/dL (ref 6–20)
CO2: 21 mmol/L — ABNORMAL LOW (ref 22–32)
Calcium: 9.1 mg/dL (ref 8.9–10.3)
Chloride: 101 mmol/L (ref 98–111)
Creatinine, Ser: 0.76 mg/dL (ref 0.61–1.24)
GFR calc Af Amer: >60 mL/min (ref >60)
GFR calc non Af Amer: >60 mL/min (ref >60)
Glucose, Bld: 111 mg/dL — ABNORMAL HIGH (ref 70–99)
Potassium: 4.2 mmol/L (ref 3.5–5.1)
Sodium: 133 mmol/L — ABNORMAL LOW (ref 135–145)
Total Bilirubin: 2.8 mg/dL — ABNORMAL HIGH (ref 0.3–1.2)
Total Protein: 7.5 g/dL (ref 6.5–8.1)

## 2019-09-09 LAB — CBC WITH DIFFERENTIAL/PLATELET
Abs Immature Granulocytes: 0.22 10*3/uL — ABNORMAL HIGH (ref 0.00–0.07)
Basophils Absolute: 0.1 10*3/uL (ref 0.0–0.1)
Basophils Relative: 1 %
Eosinophils Absolute: 0.3 10*3/uL (ref 0.0–0.5)
Eosinophils Relative: 3 %
HCT: 31.8 % — ABNORMAL LOW (ref 39.0–52.0)
Hemoglobin: 10.3 g/dL — ABNORMAL LOW (ref 13.0–17.0)
Immature Granulocytes: 2 %
Lymphocytes Relative: 24 %
Lymphs Abs: 2.8 10*3/uL (ref 0.7–4.0)
MCH: 35.6 pg — ABNORMAL HIGH (ref 26.0–34.0)
MCHC: 32.4 g/dL (ref 30.0–36.0)
MCV: 110 fL — ABNORMAL HIGH (ref 80.0–100.0)
Monocytes Absolute: 0.8 10*3/uL (ref 0.1–1.0)
Monocytes Relative: 7 %
Neutro Abs: 7.3 10*3/uL (ref 1.7–7.7)
Neutrophils Relative %: 63 %
Platelets: 157 10*3/uL (ref 150–400)
RBC: 2.89 MIL/uL — ABNORMAL LOW (ref 4.22–5.81)
RDW: 18.4 % — ABNORMAL HIGH (ref 11.5–15.5)
WBC: 11.4 10*3/uL — ABNORMAL HIGH (ref 4.0–10.5)
nRBC: 0 % (ref 0.0–0.2)

## 2019-09-09 LAB — LIPASE, BLOOD: Lipase: 41 U/L (ref 11–51)

## 2019-09-09 MED ORDER — ALBUMIN HUMAN-KJDA 25 % IV SOLN: 1.0000 | INTRAVENOUS | Status: AC | PRN

## 2019-09-09 MED ORDER — LIDOCAINE HCL 1 % IJ SOLN
INTRAMUSCULAR | Status: AC
  Filled 2019-09-09: qty 20

## 2019-09-09 NOTE — Procedures (Signed)
Ultrasound-guided  therapeutic paracentesis performed yielding 5.4 liters of yellow fluid. No immediate complications. EBL none.   

## 2019-09-09 NOTE — ED Triage Notes (Signed)
Pt having abd distention/ ascites and states needs to have fluid drained. Reports had drained 2 sundays ago.

## 2019-09-09 NOTE — Discharge Instructions (Signed)
You should be contacted by the IR department tomorrow to schedule future outpatient paracentesis procedures. If they don't call you, you can try contacting them.   We would like to get you set up on a regular outpatient procedures to avoid coming into the ER.  Talk to your hospice doctor about getting set up for placement of an abdominal drain.

## 2019-09-09 NOTE — ED Provider Notes (Signed)
[facility] DEPT Provider Note   CSN: [id number] Arrival date & time: 09/09/19  1027     History   Chief Complaint Chief Complaint  Patient presents with  . abdominal distention  . Ascites    HPI Bobby Dixon is a 58 y.o. 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  Reports last therapeutic drainage of ascites 1 week ago.  Had 5L removed at that time.  Now returning with ascites and abdominal distension.  Poor appetite, but keeping down fluids.  No vomiting.  No fevers.  No bloody stool.  Has not been able to get set up for regular paracentesis with IR or GI yet.  Home hospice doctor recommending abdominal drain placement if possible, they have home hospice nurse visiting 3 days a week.     HPI  History reviewed. No pertinent past medical history.  Patient Active Problem List   Diagnosis Date Noted  . Elevated liver enzymes 08/06/2019  . Gastrointestinal hemorrhage with melena 08/06/2019  . Alcohol abuse 08/06/2019    Past Surgical History:  Procedure Laterality Date  . ESOPHAGOGASTRODUODENOSCOPY (EGD) WITH PROPOFOL N/A 07/26/2019   Procedure: ESOPHAGOGASTRODUODENOSCOPY (EGD) WITH PROPOFOL;  Surgeon: Ronald Lobo, MD;  Location: [facility];  Service: Endoscopy;  Laterality: N/A;        Home Medications    Prior to Admission medications   Medication Sig Start Date End Date Taking? Authorizing Provider  folic acid (FOLVITE) 1 MG tablet Take 1 mg by mouth daily. 09/03/19  Yes [provider]  LORazepam (ATIVAN) 0.5 MG tablet Take 0.5 mg by mouth every 6 (six) hours as needed for anxiety.   Yes [provider]  ondansetron (ZOFRAN) 8 MG tablet Take 1 tablet (8 mg total) by mouth every 8 (eight) hours as needed for nausea or vomiting. 08/06/19  Yes Thereasa Solo, Angela M, PA-C  pantoprazole (PROTONIX) 40 MG tablet Take 1 tablet (40 mg total) by mouth daily. 08/06/19  Yes  Argentina Donovan, PA-C  traMADol (ULTRAM) 50 MG tablet Take 50 mg by mouth every 6 (six) hours as needed for moderate pain.   Yes [provider]  Albumin Human-kjda 25 % SOLN Inject 1 Bag into the vein as needed (To give if greater than 4L removed of acities). 09/09/19   Lennice Sites, DO    Family History No family history on file.  Social History Social History   Tobacco Use  . Smoking status: Not on file  Substance Use Topics  . Alcohol use: Not on file  . Drug use: Not on file     Allergies   Patient has no known allergies.   Review of Systems Review of Systems  Constitutional: Positive for appetite change. Negative for chills and fever.  Eyes: Negative for pain and visual disturbance.  Respiratory: Negative for cough and shortness of breath.   Cardiovascular: Negative for chest pain and palpitations.  Gastrointestinal: Positive for abdominal distention, abdominal pain, diarrhea and nausea. Negative for vomiting.  Musculoskeletal: Negative for arthralgias and back pain.  Skin: Negative for pallor and rash.  Neurological: Negative for syncope and light-headedness.  All other systems reviewed and are negative.    Physical Exam Updated Vital Signs BP 119/85 (BP Location: Left Arm)   Pulse 94   Temp 98.9 F (37.2 C) (Oral)   Resp 18   SpO2 94%   Physical Exam Vitals signs and nursing note reviewed.  Constitutional:  Appearance: He is well-developed.  HENT:     Head: Normocephalic and atraumatic.  Eyes:     General: Scleral icterus (mild) present.     Conjunctiva/sclera: Conjunctivae normal.  Neck:     Musculoskeletal: Neck supple.  Cardiovascular:     Rate and Rhythm: Normal rate and regular rhythm.     Pulses: Normal pulses.  Pulmonary:     Effort: Pulmonary effort is normal. No respiratory distress.  Abdominal:     General: Abdomen is flat. Bowel sounds are normal. There is distension.     Palpations: There is no mass.     Tenderness:  There is no abdominal tenderness. There is no guarding.  Skin:    General: Skin is warm and dry.     Comments: Mild diffuse jaundice  Neurological:     General: No focal deficit present.     Mental Status: He is alert and oriented to person, place, and time.     Comments: No asterixes      ED Treatments / Results  Labs (all labs ordered are listed, but only abnormal results are displayed) Labs Reviewed  CBC WITH DIFFERENTIAL/PLATELET - Abnormal; Notable for the following components:      Result Value   WBC 11.4 (*)    RBC 2.89 (*)    Hemoglobin 10.3 (*)    HCT 31.8 (*)    MCV 110.0 (*)    MCH 35.6 (*)    RDW 18.4 (*)    Abs Immature Granulocytes 0.22 (*)    All other components within normal limits  COMPREHENSIVE METABOLIC PANEL - Abnormal; Notable for the following components:   Sodium 133 (*)    CO2 21 (*)    Glucose, Bld 111 (*)    Albumin 2.9 (*)    AST 139 (*)    ALT 135 (*)    Alkaline Phosphatase 625 (*)    Total Bilirubin 2.8 (*)    All other components within normal limits  LIPASE, BLOOD    EKG None  Radiology No results found.  Procedures Procedures (including critical care time)  Medications Ordered in ED Medications - No data to display   Initial Impression / Assessment and Plan / ED Course  I have reviewed the triage vital signs and the nursing notes.  Pertinent labs & imaging results that were available during my care of the patient were reviewed by me and considered in my medical decision making (see chart for details).  Patient is a 58 year old male with end-stage liver disease on home hospice presented to the emergency department abdominal distention recurrent ascites.  Appears to be having the same symptoms he has had in the past with over distention of his abdomen, which he reports is giving him pain with skin stretching and also some shortness of breath.  He denies any fevers or chills.  He has no focal abdominal tenderness suggestive of  SBP.  I believe he is likely in need of therapeutic paracentesis in the next day or two. I will reach out to interventional radiology to see if they can fit him into their schedule today.  I discussed with him the importance of getting set up for regular outpatient paracentesis with IR. I will place that order for discharge.  They can discuss with their hospice doctor getting set up with an abdominal drain if that is the route they wish to proceed with in the future.   Clinical Course as of Sep 09 199  Tue Sep 09, 2019  [place] to Dr Sydnee Levans of IR who says they may be able to fit in patient today, recommended ordering US paracentesis   [MT]    Clinical Course User Index [MT] Modelle Vollmer, Carola Rhine, MD     Final Clinical Impressions(s) / ED Diagnoses   Final diagnoses:  Abdominal distension  Other ascites    ED Discharge Orders         Ordered    Albumin Human-kjda 25 % SOLN  As needed     09/09/19 1522    IR Paracentesis     09/09/19 1522           Wyvonnia Dusky, MD 09/10/19 0201

## 2019-09-09 NOTE — ED Notes (Signed)
Pt back from US

## 2019-09-10 ENCOUNTER — Encounter (HOSPITAL_COMMUNITY): Payer: Self-pay | Admitting: Emergency Medicine

## 2019-09-12 ENCOUNTER — Inpatient Hospital Stay (HOSPITAL_COMMUNITY): Admission: RE | Admit: 2019-09-12 | Payer: Self-pay | Source: Ambulatory Visit

## 2019-09-12 ENCOUNTER — Encounter (HOSPITAL_COMMUNITY): Payer: Self-pay | Admitting: Physician Assistant

## 2019-09-12 ENCOUNTER — Telehealth: Payer: Self-pay | Admitting: Gastroenterology

## 2019-09-12 NOTE — Progress Notes (Signed)
Spoke with patients daughter [name] who stated that [name] will be canceling his surgery and that he will not be coming for his COVID appointment

## 2019-09-12 NOTE — Telephone Encounter (Signed)
Sorry to hear that thanks for letting me know. If there is anything else we can help with please let me know, but hopefully hospice is making him comfortable

## 2019-09-12 NOTE — Telephone Encounter (Signed)
Pt's daughter [name] called to cancel pt's procedure scheduled on 10/6 at the hospital, She does not want to r/s at this time.

## 2019-09-12 NOTE — Telephone Encounter (Signed)
Daughter called and left message she wanted to cancel her dad's EGD w/banding scheduled for 09/16/19 at [facility]. I called her back and she said her Dad is not doing well at all. Hospice has been called in, he is in system failure and they have him on morphine. I called and cancelled the case and will try to move another patient up into that slot.

## 2019-09-12 NOTE — Progress Notes (Signed)
PRE-OP ENDO CALL ATTEMPTED.  PATIENT'S DAUGHTER ANSWERED. SHE DID NOT PROVIDE HER NAME  SHE INFORMED THIS RN THAT THE PROCEDURE ON 10-6 IS SUPPOSED TO BE CANCELLED AND THAT HER FATHER IS STARTING MORPHINE TODAY. RN ADVISED DAUGHTER THAT PROCEDURE IS STILL SCHEDULED AND SHE WILL NEED TO ENSURE THAT DR. ARMBRUSTER OFFICE IS AWARE THAT THEY ARE CANCELLING . DAUGHTER VERBALIZED UNDERSTANDING.

## 2019-09-16 ENCOUNTER — Encounter (HOSPITAL_COMMUNITY): Admission: RE | Payer: Self-pay | Source: Home / Self Care

## 2019-09-16 ENCOUNTER — Ambulatory Visit (HOSPITAL_COMMUNITY): Admission: RE | Admit: 2019-09-16 | Payer: Self-pay | Source: Home / Self Care | Admitting: Gastroenterology

## 2019-09-16 SURGERY — ESOPHAGOGASTRODUODENOSCOPY (EGD) WITH PROPOFOL
Anesthesia: Monitor Anesthesia Care

## 2019-09-18 ENCOUNTER — Ambulatory Visit: Payer: Self-pay | Admitting: Family Medicine

## 2019-10-07 LAB — ACID FAST CULTURE WITH REFLEXED SENSITIVITIES (MYCOBACTERIA): Acid Fast Culture: NEGATIVE

## 2019-10-12 DEATH — deceased

## 2021-04-25 IMAGING — US ULTRASOUND ABDOMEN COMPLETE
1 series · 13 of 25 positions shown · non-contrast
Comparison: None

CLINICAL DATA: Abdominal pain for several weeks. Hematemesis.
Alcohol misuse.

EXAM:
ABDOMEN ULTRASOUND COMPLETE

[Series 1: ultrasound abdomen complete · 13 of 93 slices shown]
[im 1/93]
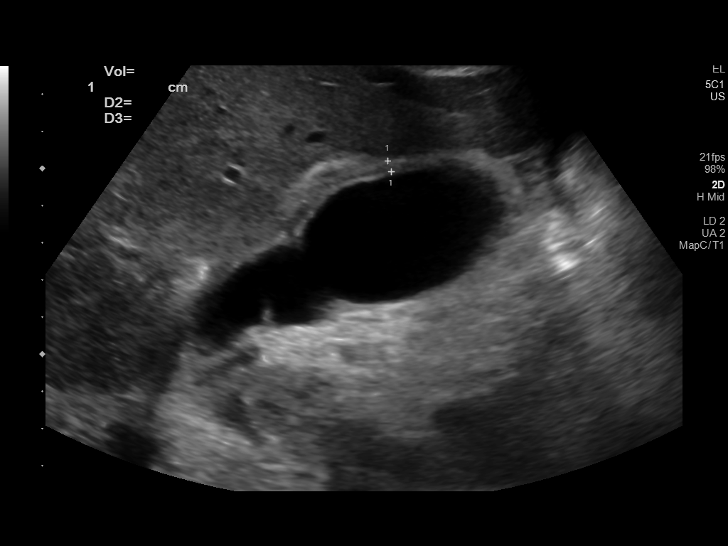
[im 8/93]
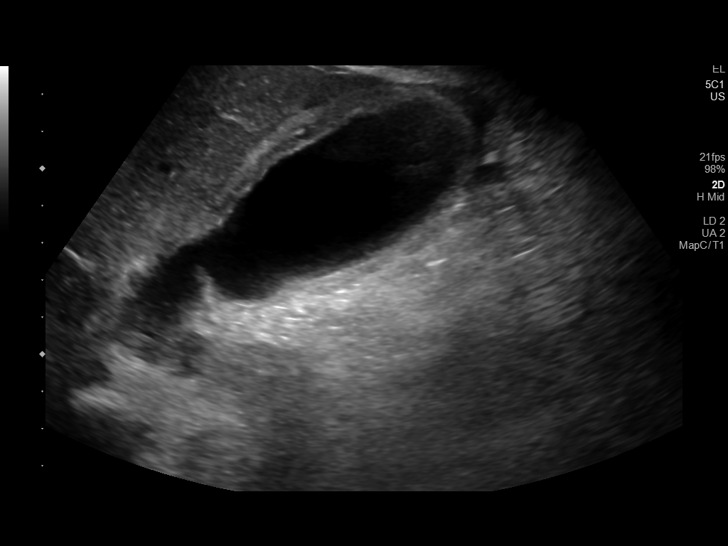
[im 16/93]
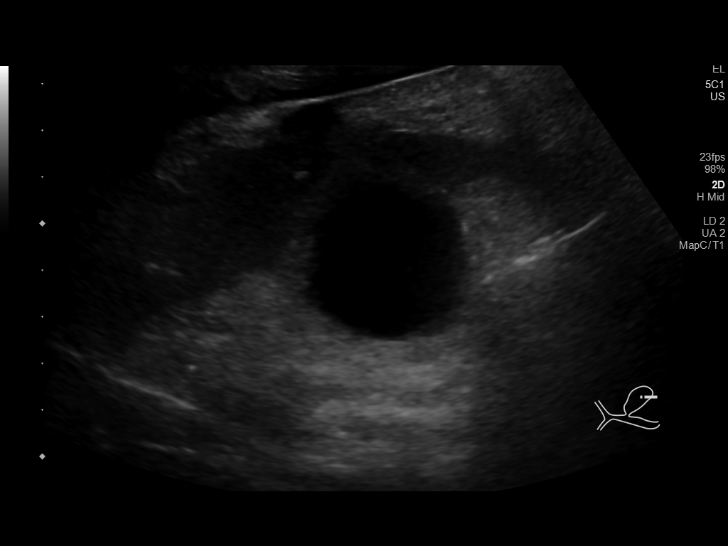
[im 24/93]
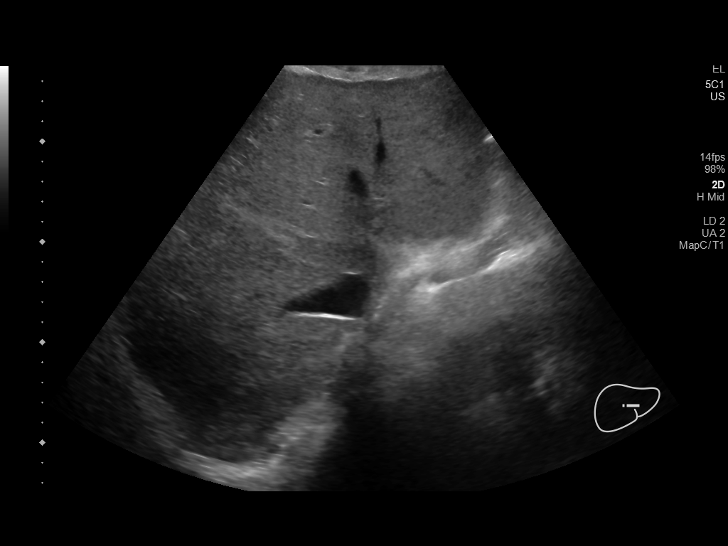
[im 31/93]
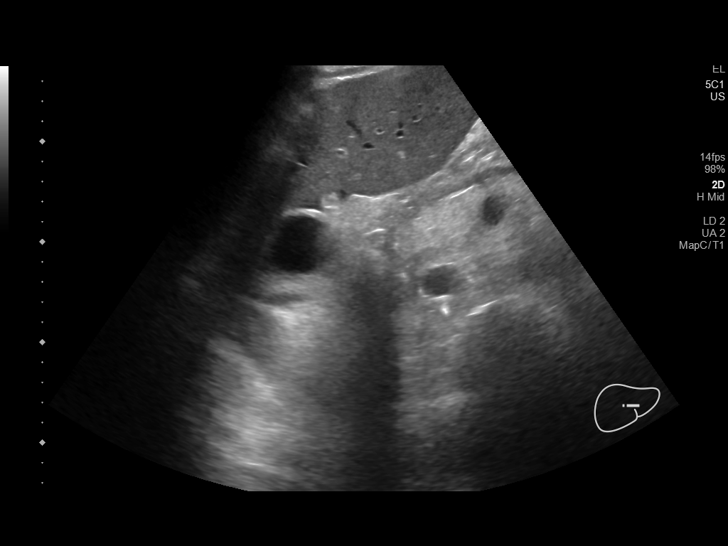
[im 39/93]
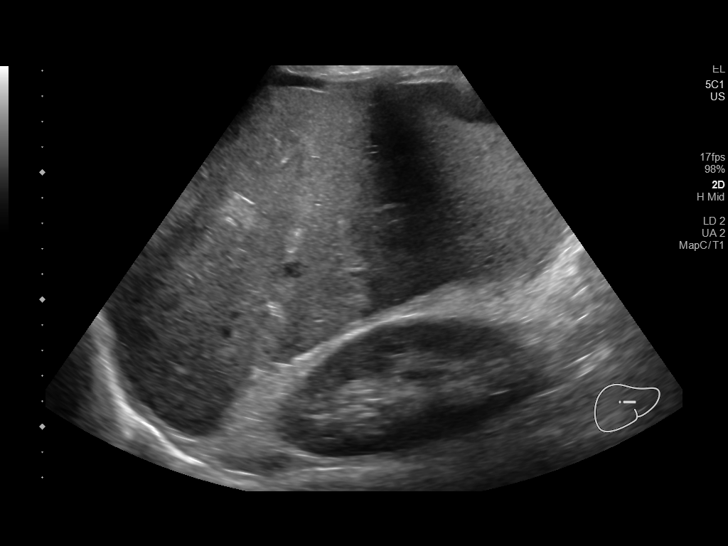
[im 47/93]
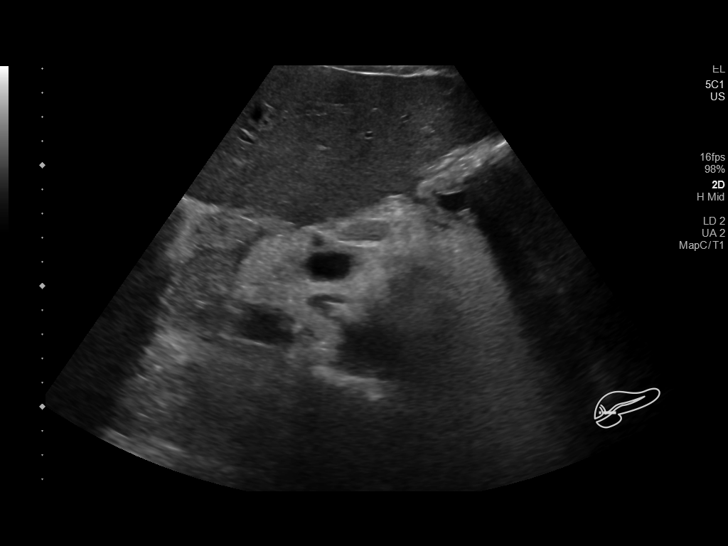
[im 54/93]
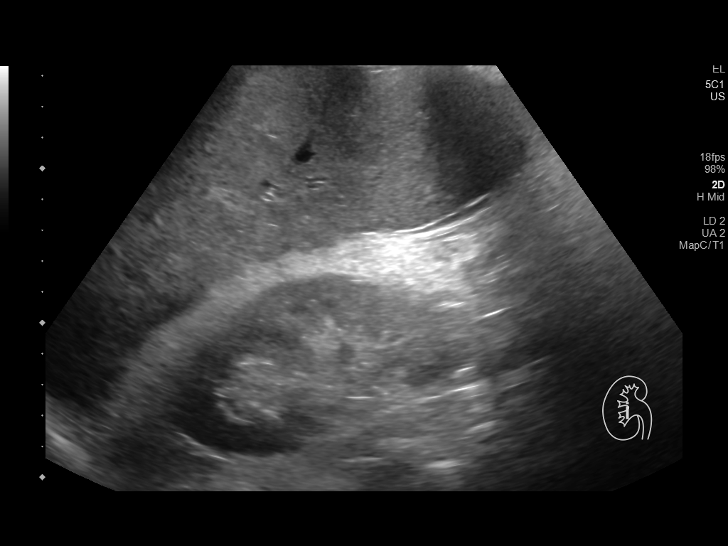
[im 62/93]
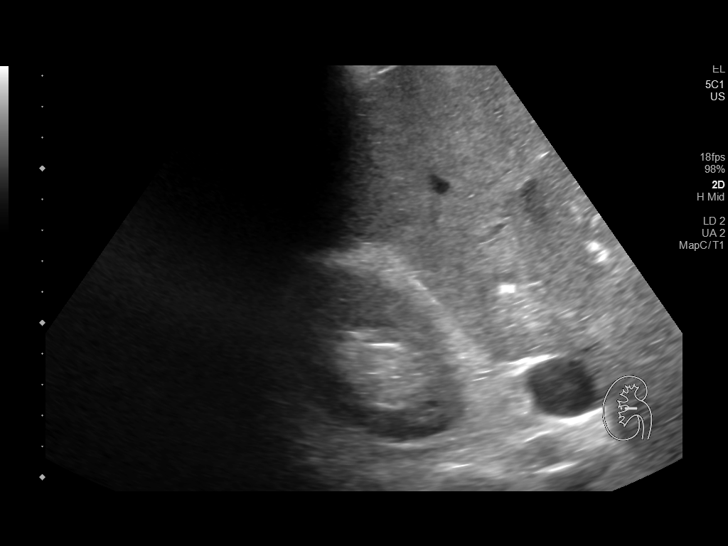
[im 70/93]
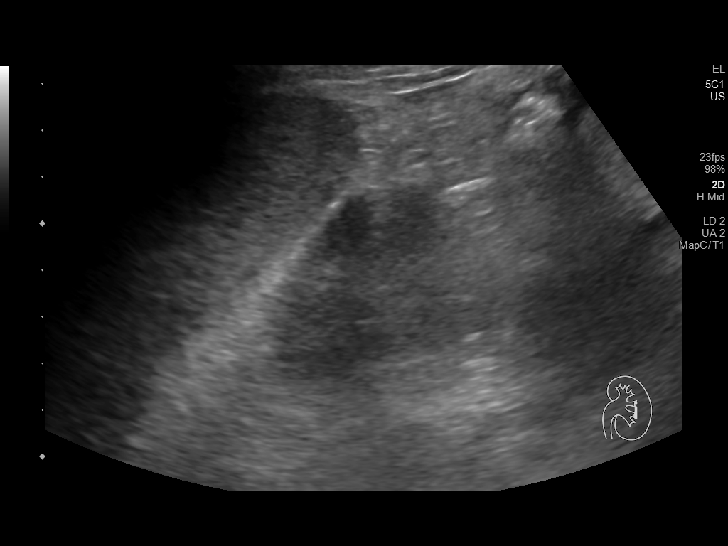
[im 77/93]
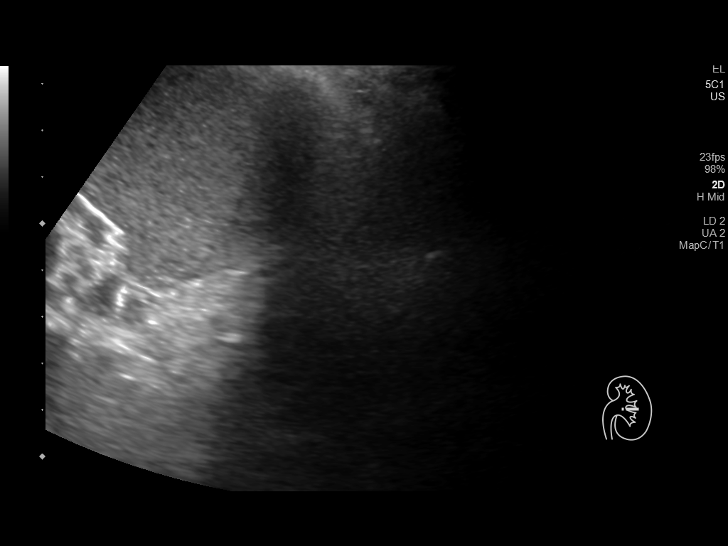
[im 85/93]
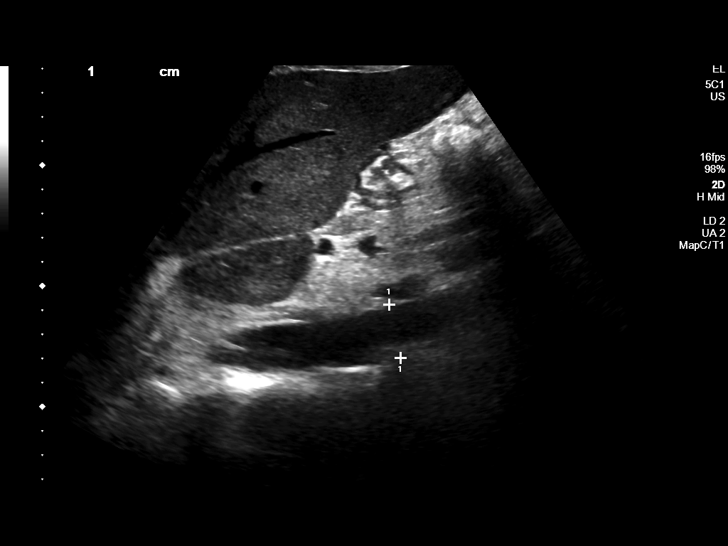
[im 93/93]
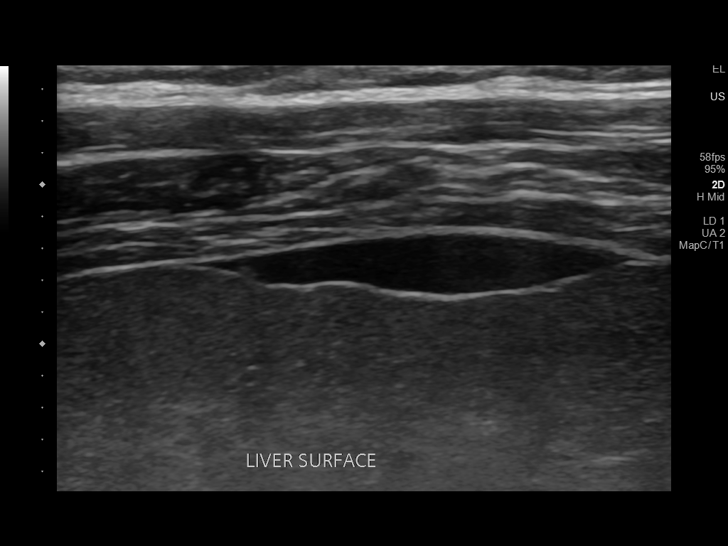

[13 of 25 positions shown; findings below may reference images not displayed]

FINDINGS: Gallbladder: No gallstones or wall thickening visualized. No
sonographic Murphy sign noted by sonographer. Diffuse gallbladder
wall thickening is likely secondary to cirrhosis.

Common bile duct: Diameter: 3 mm, within normal limits.

Liver: Nodularity of capsular contour is consistent with cirrhosis.
Within normal limits in parenchymal echogenicity. No liver mass
identified. Mild perihepatic ascites is seen. Portal vein is patent
on color Doppler imaging with normal direction of blood flow towards
the liver.

IVC: No abnormality visualized.

Pancreas: Visualized portion unremarkable.

Spleen: Size and appearance within normal limits. Mild perisplenic
ascites seen in left upper quadrant.

Right Kidney: Length: 10.8 cm. Echogenicity within normal limits. No
mass or hydronephrosis visualized.

Left Kidney: Length: 10.5 cm. Echogenicity within normal limits. No
mass or hydronephrosis visualized.

Abdominal aorta: No aneurysm visualized.

Other findings: None.
IMPRESSION: Hepatic cirrhosis.  No liver mass visualized.

Mild ascites.

No evidence of gallstones or biliary ductal dilatation. Diffuse
gallbladder wall thickening is likely secondary to cirrhosis.

## 2021-05-24 IMAGING — US US PARACENTESIS
1 series · 6 of 6 positions shown · non-contrast
Comparison: none

INDICATION: Ascites secondary to alcoholic cirrhosis. Request for diagnostic and
therapeutic paracentesis.

[Series 1: us paracentesis · 6 of 6 slices shown]
[im 1/6]
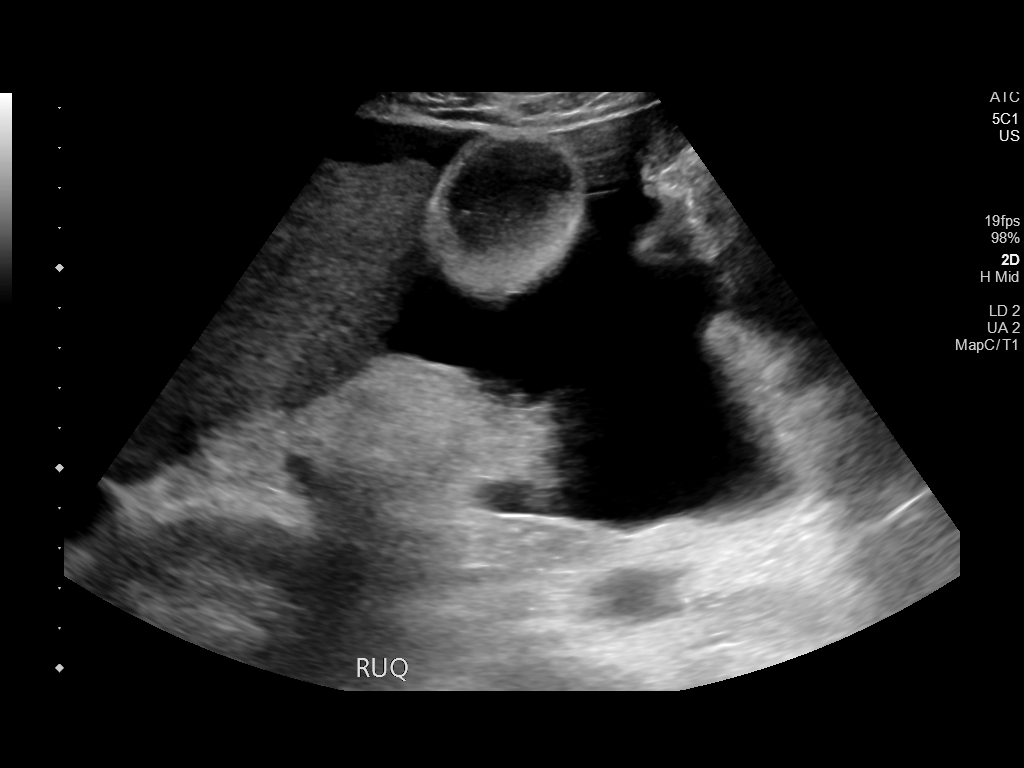
[im 2/6]
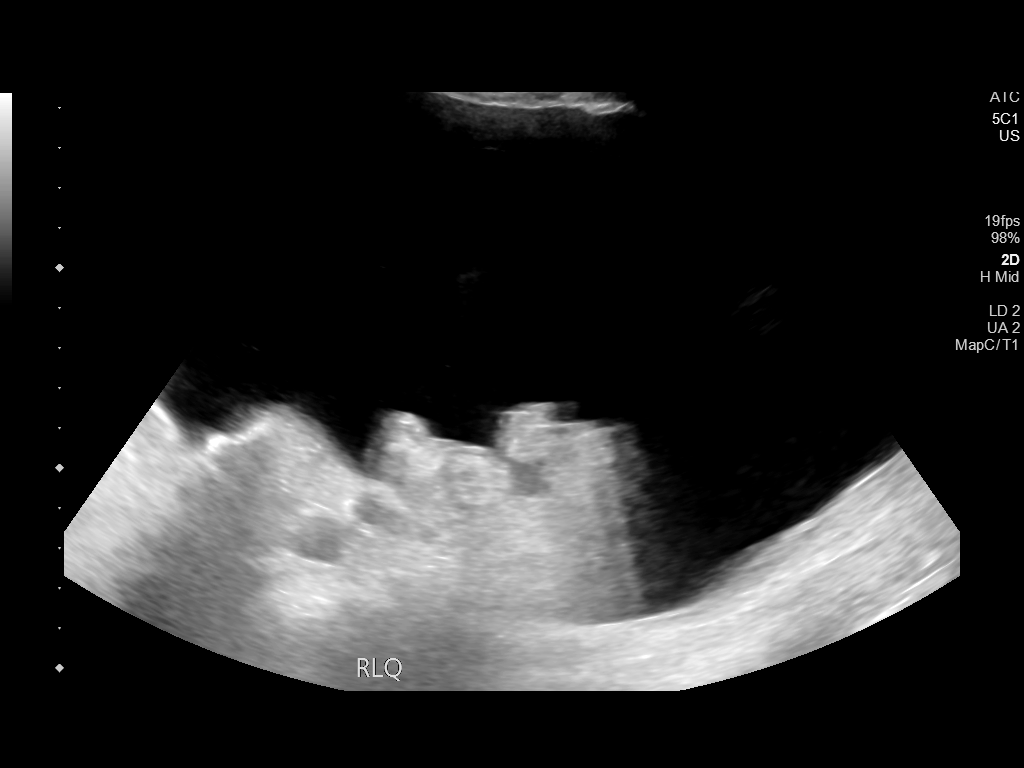
[im 3/6]
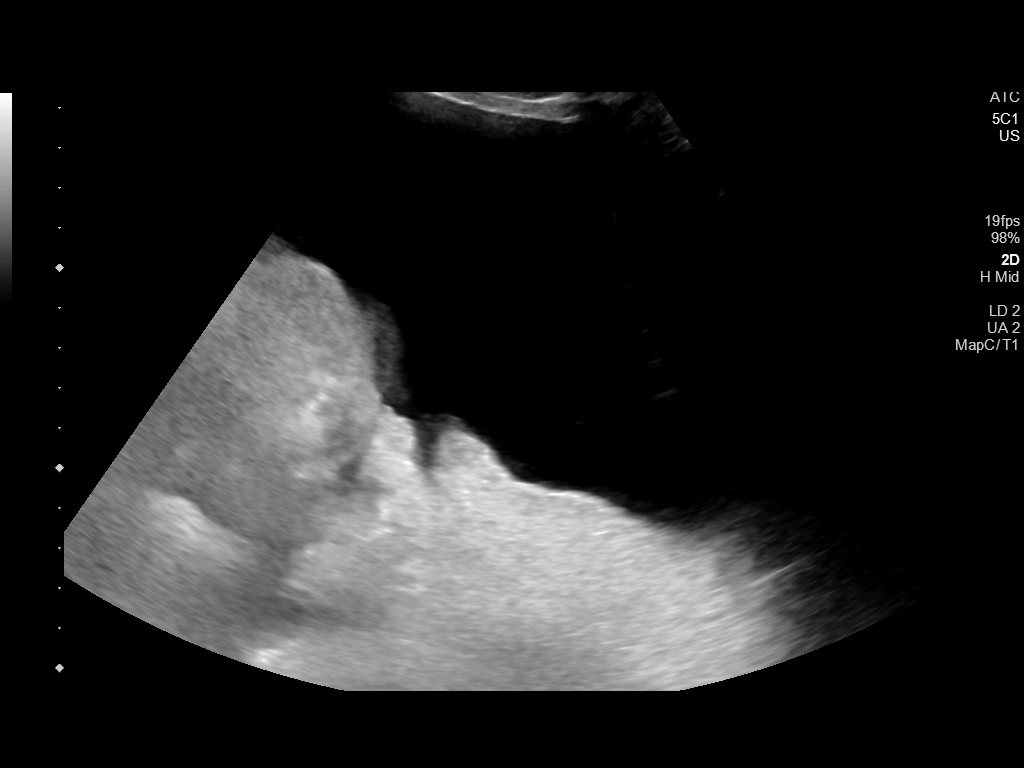
[im 4/6]
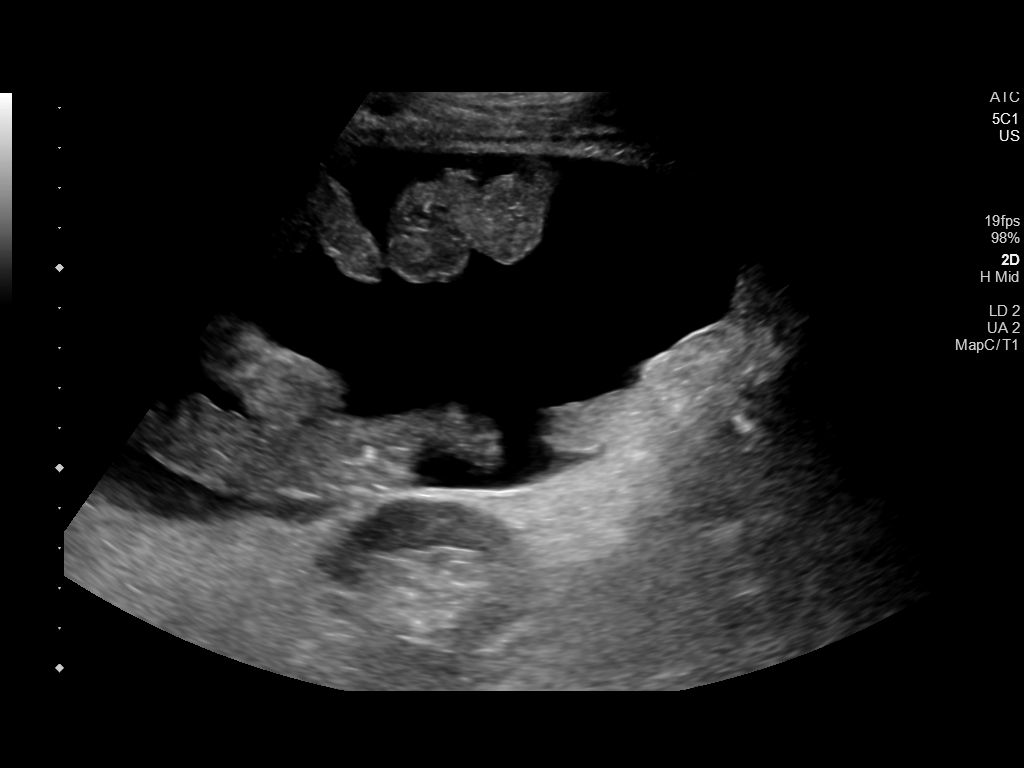
[im 5/6]
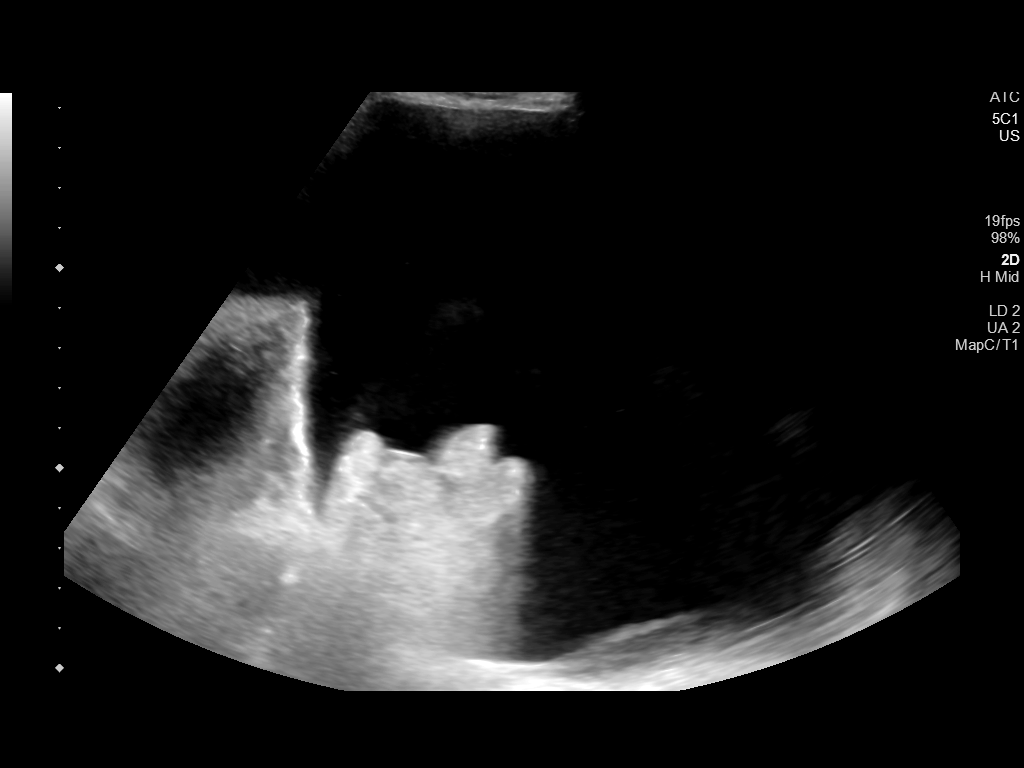
[im 6/6]
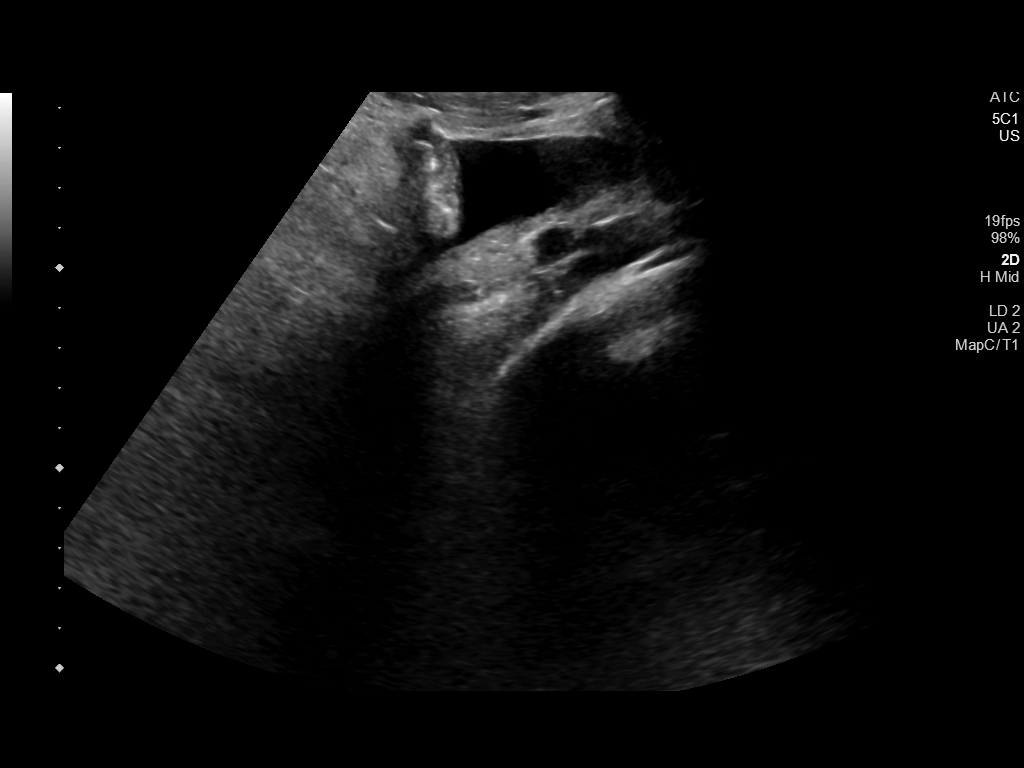

[6 of 6 positions shown; findings below may reference images not displayed]

EXAM:
ULTRASOUND GUIDED PARACENTESIS

MEDICATIONS:
1% lidocaine 10 mL

COMPLICATIONS:
None immediate.

PROCEDURE:
Informed written consent was obtained from the patient after a
discussion of the risks, benefits and alternatives to treatment. A
timeout was performed prior to the initiation of the procedure.

Initial ultrasound scanning demonstrates a moderate amount of
ascites within the right lower abdominal quadrant. The right lower
abdomen was prepped and draped in the usual sterile fashion. 1%
lidocaine with epinephrine was used for local anesthesia.

Following this, a 19 gauge, 7-cm, Yueh catheter was introduced. An
ultrasound image was saved for documentation purposes. The
paracentesis was performed. The catheter was removed and a dressing
was applied. The patient tolerated the procedure well without
immediate post procedural complication.
FINDINGS: A total of approximately 3.8 of clear yellow fluid was removed.
Samples were sent to the laboratory as requested by the clinical
team.
IMPRESSION: Successful ultrasound-guided paracentesis yielding 3.8 liters of
peritoneal fluid.
